# Patient Record
Sex: Male | Born: 1985 | Race: White | Hispanic: No | Marital: Married | Smoking: Never smoker
Health system: Southern US, Community
[De-identification: ages and names within clinical notes are randomized; demographics above are authoritative.]

---
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
## 2009-12-13 ENCOUNTER — Encounter: Admission: RE | Admit: 2009-12-13 | Discharge: 2009-12-13 | Payer: Self-pay | Admitting: Occupational Medicine

## 2010-03-01 ENCOUNTER — Encounter: Payer: Self-pay | Admitting: Family Medicine

## 2010-03-15 ENCOUNTER — Encounter: Payer: Self-pay | Admitting: Family Medicine

## 2010-03-15 ENCOUNTER — Ambulatory Visit: Payer: Self-pay | Admitting: Sports Medicine

## 2010-03-15 DIAGNOSIS — M7520 Bicipital tendinitis, unspecified shoulder: Secondary | ICD-10-CM | POA: Insufficient documentation

## 2010-07-05 NOTE — Consult Note (Signed)
Summary: [facility] [place]   Imported By: Marily Memos 03/03/2010 11:57:02  _____________________________________________________________________  External Attachment:    Type:   Image     Comment:   External Document

## 2010-07-05 NOTE — Letter (Signed)
Summary: *Consult Note  [facility]  [address]   [place], [place] [address]   Phone: [phone]  Fax: [phone]    Re:    Jeremy Bautista DOB:    [birth year]   Dear Dr. Kathrine Haddock, MD:    Thank you for requesting that we see the above patient for consultation.  A copy of the detailed office note will be sent under separate cover, for your review.  Evaluation today is consistent with:  1) BICEPS TENDINITIS, RIGHT (ICD-726.12)     - MSK ultrasound did not reveal any signs of tendon tear, but only fluid surrounding the tendon consistent with tendinitis.   Our recommendation is for:  - Continue with current restrictions, may increase amount of push-ups as pain allows, but should increase gradually.  Patient is currently able to do 12 push-ups. - Should ice shoulder/upper arm after work-outs/drills - Continue ibuprofen as needed   New Orders include:  [place] LIMITED ([id number])   New Medications started today include: None   After today's visit, the patients current medications include:   Thank you for this consultation.  If you have any further questions regarding the care of this patient, please do not hesitate to contact me @ [phone].   Thank you for this opportunity to look after your patient.  Sincerely,   Darene Lamer DO [facility]

## 2010-07-05 NOTE — Assessment & Plan Note (Signed)
Summary: W/C,U/S RT BICEP,MC   Vital Signs:  Patient profile:   25 year old male Height:      72 inches Weight:      235 pounds BMI:     31.99 Pulse rate:   76 / minute BP sitting:   143 / 80  (right arm)  Vitals Entered By: Rochele Pages RN (March 15, 2010 2:22 PM) CC: rt shoulder injury WC 02/11/10   CC:  rt shoulder injury WC 02/11/10.  History of Present Illness: 24yo R-hand dominant male to office with c/o R shoulder pain.  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  During exercise had discomfort in upper arm/shoulder & noticed this to be much worse with push-ups.  Pain increased in intensity & eventually evaluated by [facility].  Dx'd with bicep tendonitis & placed on light duty with no push-ups.  Pain persisted & evaluated by [facility], again dx'd with bicep tendonitis.  Referred to [place] for MSK U/S.  Pt cont to have pain in anterior shoulder.  States pain approx. 80% improved compared to 4-weeks ago. Able to do 12 push-ups without difficulty, was previously able to do over 40 push-ups prior to injury. Taking ibuprofen as needed which seems to help.  Is also icing & using heating pad as needed. Denies any popping or tearing sensation.  Denies any deformity of his bicep.  Denies prior shoulder or bicep injury.  Denies numbness/tingling.  Otherwise healthy without any other medical problems.  Allergies (verified): No Known Drug Allergies  Past History:  Past Medical History: Unremarkable  Past Surgical History: Denies surgical history  Review of Systems      See HPI  Physical Exam  General:  Well-developed,well-nourished,in no acute distress; alert,appropriate and cooperative throughout examination Msk:  NECK: normal ROM without pain or tenderness.  SHOULDER: Rt Shoulder: Inspection reveals no abnormalities, atrophy or asymmetry.  No  deformity of bicep noted. Mildly TTP over bicipital groove, no TTP over [facility] Joint. ROM is full in all planes without pain. Rotator cuff strength normal throughout.  Bicep strength +5/5 with mild pain, tricep strength +5/5 without pain. No signs of impingement with negative Neer and Hawkin's tests, empty can. Speeds & Yergason's mildly positive. No labral pathology noted with negative Obrien's and good stability. Normal scapular function observed. No painful arc and no drop arm sign. No apprehension sign - Lt shoulder with full ROM without pain, tenderness, weakness, instability.    Pulses:  +2/4 radial b/l Neurologic:  sensation intact to light touch.   Additional Exam:  MSK U/S: Rt shoulder - biceps tendon with surrounding fluid & (+)halo sign, but no signs of tendon or muscle tear.  Normal appearing supraspinatus, subscap, infraspinatus.  Normal appearing AC joint.  No dynamic impingement noted.  No increased doppler flow.  Comparison images of Lt bicep showed no surrounding fluid & no halo sign.  Images saved.   Impression & Recommendations:  Problem # 1:  BICEPS TENDINITIS, RIGHT (ICD-726.12) - Exam & MSK U/S finding consistent with biceps tendonitis on the right, no signs of tendon tear. - Pt has been improving & is approx. 80% improved from initial injury 02/11/10 - Should continue with current restrictions, but may increase number of push-ups gradually.  Should progress as pain allows, should decrease amount of push-ups if having increased pain. - Cont. ibuprofen as needed - Should ice following work-outs/drills. -  Follow-up with [place] on an as needed basis - Follow-up with Dr. Alto Denver at [facility] as directed  Orders: [place] LIMITED [phone])  Patient Instructions: 1)  You have biceps tendonitis of your right shoulder.  There were no signs of a tear on ultrasound today. 2)  You may continue with your current restrictions & slowly increase your intensity over the  next several weeks. 3)  You are currently able to do 12 push-ups, work to 15, then 20, then gradually increase as able.  You should decrease intensity if having increased pain. 4)  Cont. to use ibuprofen/advil as needed. 5)  Cont. to ice after workouts. 6)  Follow-up with [place] as needed or if not improving.

## 2013-01-13 ENCOUNTER — Encounter: Payer: Self-pay | Admitting: Emergency Medicine

## 2013-01-13 ENCOUNTER — Emergency Department
Admission: EM | Admit: 2013-01-13 | Discharge: 2013-01-13 | Disposition: A | Discharge status: Home / Self Care | Payer: 59 | Source: Home / Self Care | Attending: Family Medicine | Admitting: Family Medicine

## 2013-01-13 DIAGNOSIS — L255 Unspecified contact dermatitis due to plants, except food: Secondary | ICD-10-CM

## 2013-01-13 MED ORDER — TRIAMCINOLONE ACETONIDE 40 MG/ML IJ SUSP: 40.0000 mg | Freq: Once | INTRAMUSCULAR | Status: DC

## 2013-01-13 MED ORDER — DOMEBORO 25 % EX PACK: PACK | CUTANEOUS | Status: DC

## 2013-01-13 NOTE — ED Provider Notes (Signed)
  CSN: [id number]     Arrival date & time 01/13/13  6295 History     First MD Initiated Contact with Patient 01/13/13 1014     Chief Complaint  Patient presents with  . Rash      HPI Comments: Patient was trimming a tree 3 days ago, and subsequently developed a poison ivy rash on his left arm and left chest.  Patient is a 27 y.o. male presenting with poison ivy. The history is provided by the patient.  Poison [place] This is a new problem. Episode onset: 3 days ago. The problem occurs constantly. The problem has not changed since onset.Associated symptoms comments: none. Nothing aggravates the symptoms. Nothing relieves the symptoms. Treatments tried: Calamine lotion. The treatment provided mild relief.    History reviewed. No pertinent past medical history. Past Surgical History  Procedure Laterality Date  . Hernial cyst     Family History  Problem Relation Age of Onset  . Cancer Father   . Diabetes Father   . Hypertension Father    History  Substance Use Topics  . Smoking status: Never Smoker   . Smokeless tobacco: Not on file  . Alcohol Use: Yes    Review of Systems  All other systems reviewed and are negative.    Allergies  Review of patient's allergies indicates no known allergies.  Home Medications   Current Outpatient Rx  Name  Route  Sig  Dispense  Refill  . Alum Sulfate-Ca Acetate (DOMEBORO) 25 % PACK      Dissolve in water and apply as a compress TID   12 each   1    BP 129/80  Pulse 57  Temp(Src) 97.9 F (36.6 C) (Oral)  Ht 6\' 1"  (1.854 m)  Wt 257 lb (116.574 kg)  BMI 33.91 kg/m2  SpO2 99% Physical Exam  Nursing note and vitals reviewed. Constitutional: He is oriented to person, place, and time. He appears well-developed and well-nourished. No distress.  HENT:  Head: Atraumatic.  Eyes: Conjunctivae are normal. Pupils are equal, round, and reactive to light.  Neurological: He is alert and oriented to person, place, and time.  Skin: Skin is warm  and dry. Rash noted. Rash is vesicular.     On the left arm biceps area is a vesicular eruption on erythematous base about 5cm by 10cm.  Vesicles contain clear fluid.  No tenderness or swelling.  A similar lesion is present on the left lateral chest.  No evidence cellulitis.    ED Course   Procedures  none    1. Rhus dermatitis     MDM  Kenalog 40mg  IM Begin DomeBoro compresses. May take Benadryl 25mg , two caps at bedtime. Return if develops increased pain, redness, drainage, etc.  Call if not improving 3 to 4 days (could add prednisone burst)  Lattie Haw, MD 01/13/13 1036

## 2013-01-13 NOTE — ED Notes (Signed)
Poison Ivy on left arm, trunk x 3 days

## 2013-01-15 ENCOUNTER — Telehealth: Payer: Self-pay | Admitting: *Deleted

## 2013-01-23 ENCOUNTER — Encounter: Payer: Self-pay | Admitting: Emergency Medicine

## 2013-01-23 ENCOUNTER — Emergency Department
Admission: EM | Admit: 2013-01-23 | Discharge: 2013-01-23 | Disposition: A | Discharge status: Home / Self Care | Payer: 59 | Source: Home / Self Care | Attending: Emergency Medicine | Admitting: Emergency Medicine

## 2013-01-23 DIAGNOSIS — L255 Unspecified contact dermatitis due to plants, except food: Secondary | ICD-10-CM

## 2013-01-23 DIAGNOSIS — L237 Allergic contact dermatitis due to plants, except food: Secondary | ICD-10-CM

## 2013-01-23 MED ORDER — PREDNISONE 10 MG PO TABS: ORAL_TABLET | ORAL | Status: DC

## 2013-01-23 MED ORDER — TRIAMCINOLONE ACETONIDE 0.1 % EX CREA: TOPICAL_CREAM | Freq: Two times a day (BID) | CUTANEOUS | Status: DC

## 2013-01-23 NOTE — ED Provider Notes (Signed)
CSN: [id number]     Arrival date & time 01/23/13  4098 History     First MD Initiated Contact with Patient 01/23/13 0845     Chief Complaint  Patient presents with  . Poison Ivy   (Consider location/radiation/quality/duration/timing/severity/associated sxs/prior Treatment) HPI This patient's followup from his visit last week in which he was diagnosed with poison ivy.  He he states that the shot and the cream did not help but then started taking prednisone and it seemed to be drying up a lot.  However while he was tapered off at the end the poison ivy came back more then it began.  He does have some itching but there is a lot of swelling redness and irritation of his abdomen and left arm.  He also feels that is a firm swelling that seems to be getting worse.  History reviewed. No pertinent past medical history. Past Surgical History  Procedure Laterality Date  . Hernial cyst     Family History  Problem Relation Age of Onset  . Cancer Father   . Diabetes Father   . Hypertension Father    History  Substance Use Topics  . Smoking status: Never Smoker   . Smokeless tobacco: Not on file  . Alcohol Use: Yes    Review of Systems  All other systems reviewed and are negative.    Allergies  Review of patient's allergies indicates no known allergies.  Home Medications   Current Outpatient Rx  Name  Route  Sig  Dispense  Refill  . Alum Sulfate-Ca Acetate (DOMEBORO) 25 % PACK      Dissolve in water and apply as a compress TID   12 each   1   . predniSONE (DELTASONE) 10 MG tablet      20mg  TID for 3 days, then 20mg  BID for 3 days, then 10mg  BID for 3 days, then 10mg  QD for 3 days.  Disp QS   42 tablet   0   . triamcinolone cream (KENALOG) 0.1 %   Topical   Apply topically 2 (two) times daily. Apply to affected area BID (torso and L arm)   454 g   0    BP 142/90  Pulse 78  Temp(Src) 98 F (36.7 C) (Oral)  Ht 6\' 1"  (1.854 m)  Wt 250 lb (113.399 kg)  BMI 32.99 kg/m2   SpO2 99% Physical Exam  Nursing note and vitals reviewed. Constitutional: He is oriented to person, place, and time. He appears well-developed and well-nourished. No distress.  HENT:  Head: Normocephalic and atraumatic.  Eyes: No scleral icterus.  Neck: Neck supple.  Cardiovascular: Regular rhythm and normal heart sounds.   Pulmonary/Chest: Effort normal and breath sounds normal. No respiratory distress.  Neurological: He is alert and oriented to person, place, and time.  Skin: Skin is warm and dry.     Patient with a poison ivy dermatitis on the areas marked.  It is swollen and indurated with no fluctuance.  Careful inspection shows no definite cellulitis or areas of infection.  No tenderness to palpation.   Psychiatric: He has a normal mood and affect. His speech is normal.    ED Course   Procedures (including critical care time)  Labs Reviewed - No data to display No results found. 1. Poison ivy     MDM    patient has severe case of poison ivy dermatitis.  I discussed with him that the first prednisone dose is probably too short for his case.  I extended it an additional 12 days as well as giving him triamcinolone cream to use symptomatically.  I suspect that over the course of the next one to 2 weeks and should improve.  If it's not and he wanted to follow up with dermatologist.  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  Also should take cool showers and use cool compresses.  Can use over-the-counter antihistamines if necessary.  Patient understands and agrees to this plan.  In addition I do not see any signs of cellulitis however feet develops worsening symptoms it may be reasonable to put him on a short course of Keflex.    Marlaine Hind, MD 01/23/13 1004

## 2013-01-23 NOTE — ED Notes (Signed)
Poison Ivy x 2 weeks, trunk, left arm, saw him last week treated with kenalog, called in prednisone, it got better, but now it's a lot worse.

## 2013-01-30 ENCOUNTER — Telehealth: Payer: Self-pay | Admitting: *Deleted

## 2013-10-02 ENCOUNTER — Emergency Department
Admission: EM | Admit: 2013-10-02 | Discharge: 2013-10-02 | Disposition: A | Discharge status: Home / Self Care | Payer: 59 | Source: Home / Self Care | Attending: Emergency Medicine | Admitting: Emergency Medicine

## 2013-10-02 ENCOUNTER — Encounter: Payer: Self-pay | Admitting: Emergency Medicine

## 2013-10-02 DIAGNOSIS — L255 Unspecified contact dermatitis due to plants, except food: Secondary | ICD-10-CM

## 2013-10-02 DIAGNOSIS — L237 Allergic contact dermatitis due to plants, except food: Secondary | ICD-10-CM

## 2013-10-02 MED ORDER — PREDNISONE 10 MG PO TABS: ORAL_TABLET | ORAL | Status: DC

## 2013-10-02 NOTE — ED Notes (Signed)
Jeremy Bautista c/o poison ivy rash to both arms and left ear x 3-4 days.

## 2013-10-02 NOTE — ED Provider Notes (Signed)
CSN: [id number]     Arrival date & time 10/02/13  1430 History   First MD Initiated Contact with Patient 10/02/13 1432     Chief Complaint  Patient presents with  . Poison Ivy   (Consider location/radiation/quality/duration/timing/severity/associated sxs/prior Treatment) HPI Severe, worsening, pruritic poison ivy rash for 4 days. Both arms and left external ear, now starting on left trunk. History reviewed. No pertinent past medical history. Past Surgical History  Procedure Laterality Date  . Hernial cyst     Family History  Problem Relation Age of Onset  . Cancer Father   . Diabetes Father   . Hypertension Father    History  Substance Use Topics  . Smoking status: Never Smoker   . Smokeless tobacco: Never Used  . Alcohol Use: Yes    Review of Systems  All other systems reviewed and are negative.   Allergies  Review of patient's allergies indicates no known allergies.  Home Medications   Prior to Admission medications   Medication Sig Start Date End Date Taking? Authorizing Provider  Alum Sulfate-Ca Acetate (DOMEBORO) 25 % PACK Dissolve in water and apply as a compress TID 01/13/13   Lattie HawStephen A Beese, MD  predniSONE (DELTASONE) 10 MG tablet Days 1 & 2, take 6 daily. Days 3 & 4, take 5 daily. Days 5 & 6: 4 daily. Days 7 &8: 3 daily. Days 9 &10: 2 daily. Days 11 &12: 1 daily. 10/02/13   Lajean Manesavid Massey, MD   BP 133/83  Pulse 73  Temp(Src) 98.5 F (36.9 C) (Oral)  Resp 14  Wt 267 lb (121.11 kg)  SpO2 98% Physical Exam  Nursing note and vitals reviewed. Constitutional: He is oriented to person, place, and time. He appears well-developed and well-nourished. No distress.  HENT:  Head: Normocephalic and atraumatic.  Eyes: Conjunctivae and EOM are normal. Pupils are equal, round, and reactive to light. No scleral icterus.  Neck: Normal range of motion.  Cardiovascular: Normal rate.   Pulmonary/Chest: Effort normal.  Abdominal: He exhibits no distension.  Musculoskeletal:  Normal range of motion.  Neurological: He is alert and oriented to person, place, and time.  Skin: Skin is warm.  Poison ivy type rash. Erythematous papules in clusters and streaks, some vesicles. No pustules or drainage. Located both arms, left external ear, and a few on trunk  Psychiatric: He has a normal mood and affect.    ED Course  Procedures (including critical care time) Labs Review Labs Reviewed - No data to display  Imaging Review No results found.   MDM   1. Poison ivy dermatitis    Treatment options discussed, as well as risks, benefits, alternatives. Patient voiced understanding and agreement with the following plans: Prednisone 10 mg-12 day Dosepak (he states that in the past, 6 day Dosepak has not been long enough treatment to resolve poison ivy) Other symptomatic care discussed Domeboro's topically which he has at home Follow-up with your primary care doctor in 5-7 days if not improving, or sooner if symptoms become worse. Precautions discussed. Red flags discussed. Questions invited and answered. Patient voiced understanding and agreement.     Lajean Manesavid Massey, MD 10/02/13 2116

## 2014-01-15 ENCOUNTER — Emergency Department
Admission: EM | Admit: 2014-01-15 | Discharge: 2014-01-15 | Disposition: A | Discharge status: Home / Self Care | Payer: 59 | Source: Home / Self Care | Attending: Emergency Medicine | Admitting: Emergency Medicine

## 2014-01-15 ENCOUNTER — Encounter: Payer: Self-pay | Admitting: Emergency Medicine

## 2014-01-15 DIAGNOSIS — L237 Allergic contact dermatitis due to plants, except food: Secondary | ICD-10-CM

## 2014-01-15 DIAGNOSIS — L255 Unspecified contact dermatitis due to plants, except food: Secondary | ICD-10-CM

## 2014-01-15 MED ORDER — PREDNISONE 10 MG PO TABS: ORAL_TABLET | ORAL | Status: DC

## 2014-01-15 NOTE — ED Notes (Signed)
8 days ago noticed rash on left ankle, now left upper arm and a few spots on his trunk. He has been treated for this here before and had a severe case last summer. 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

## 2014-01-15 NOTE — ED Provider Notes (Signed)
CSN: [id number]     Arrival date & time 01/15/14  1246 History   First MD Initiated Contact with Patient 01/15/14 1250     Chief Complaint  Patient presents with  . Poison Ivy   (Consider location/radiation/quality/duration/timing/severity/associated sxs/prior Treatment) HPI Exposed to poison ivy in his yard Has severe pruritic poison ivy rash. 8 days ago noticed rash on left ankle, now left upper arm and a few spots on his trunk. He has been treated for this here before and had a severe case last summer. 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 Topical steroids only helping minimally History reviewed. No pertinent past medical history. Past Surgical History  Procedure Laterality Date  . Hernial cyst     Family History  Problem Relation Age of Onset  . Cancer Father   . Diabetes Father   . Hypertension Father    History  Substance Use Topics  . Smoking status: Never Smoker   . Smokeless tobacco: Never Used  . Alcohol Use: Yes    Review of Systems  All other systems reviewed and are negative.   Allergies  Review of patient's allergies indicates not on file.  Home Medications   Prior to Admission medications   Medication Sig Start Date End Date Taking? Authorizing Provider  Alum Sulfate-Ca Acetate (DOMEBORO) 25 % PACK Dissolve in water and apply as a compress TID 01/13/13   Lattie HawStephen A Beese, MD  predniSONE (DELTASONE) 10 MG tablet Days 1 & 2, take 6 daily. Days 3 & 4, take 5 daily. Days 5 & 6: 4 daily. Days 7 &8: 3 daily. Days 9 &10: 2 daily. Days 11 &12: 1 daily. 01/15/14   Lajean Manesavid Massey, MD   BP 130/80  Pulse 76  Temp(Src) 98.6 F (37 C) (Oral)  Ht 6\' 1"  (1.854 m)  Wt 280 lb (127.007 kg)  BMI 36.95 kg/m2  SpO2 99% Physical Exam  Nursing note and vitals reviewed. Constitutional: He is oriented to person, place, and time. He appears well-developed and well-nourished. No distress.  HENT:  Head: Normocephalic and atraumatic.  Eyes:  Conjunctivae and EOM are normal. Pupils are equal, round, and reactive to light. No scleral icterus.  Neck: Normal range of motion.  Cardiovascular: Normal rate.   Pulmonary/Chest: Effort normal.  Abdominal: He exhibits no distension.  Musculoskeletal: Normal range of motion.  Neurological: He is alert and oriented to person, place, and time.  Skin: Skin is warm.  Severe poison ivy rash in patches on trunk and extremities few on neck. Erythematous, vesicles.  Psychiatric: He has a normal mood and affect.    ED Course  Procedures (including critical care time) Labs Review Labs Reviewed - No data to display  Imaging Review No results found.   MDM   1. Poison ivy dermatitis    Treatment options discussed, as well as risks, benefits, alternatives. Patient voiced understanding and agreement with the following plans: He declined a shot of steroids such as Depo-Medrol. Prednisone 10 mg-12 day Dosepak Other symptomatic care  Followup with PCP or dermatologist prn Precautions discussed. Red flags discussed. Questions invited and answered. Patient voiced understanding and agreement.     Lajean Manesavid Massey, MD 01/15/14 [phone]

## 2014-04-21 ENCOUNTER — Encounter: Payer: Self-pay | Admitting: Family Medicine

## 2014-04-21 ENCOUNTER — Ambulatory Visit (INDEPENDENT_AMBULATORY_CARE_PROVIDER_SITE_OTHER): Payer: 59 | Admitting: Family Medicine

## 2014-04-21 VITALS — BP 120/78 | Ht 72.0 in | Wt 288.0 lb

## 2014-04-21 DIAGNOSIS — R5383 Other fatigue: Secondary | ICD-10-CM

## 2014-04-21 DIAGNOSIS — G5710 Meralgia paresthetica, unspecified lower limb: Secondary | ICD-10-CM | POA: Insufficient documentation

## 2014-04-21 DIAGNOSIS — N529 Male erectile dysfunction, unspecified: Secondary | ICD-10-CM

## 2014-04-21 DIAGNOSIS — G5712 Meralgia paresthetica, left lower limb: Secondary | ICD-10-CM

## 2014-04-21 DIAGNOSIS — R6882 Decreased libido: Secondary | ICD-10-CM

## 2014-04-21 NOTE — Progress Notes (Signed)
CC: Jeremy Bautista is a 28 y.o. 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 He denies any other genitourinary complaints. Symptoms have been worsening over the past 6 months and can occur any day. It has been accompanied by significant decline in his libido despite still being physically and emotionally attracted to his wife of one year. Nothing particularly makes symptoms better or worse. Overall symptoms are described as moderate to severe in severity. Denies testicular pain, swelling, penile discharge, dysuria, nor mental disturbance.  Complains of fatigue described further as lethargy that occurs on a daily basis. He says it's also accompanied by nonrestorative sleep and often the feeling that he could doze off at work. He works on the night shift and never on the day shift. Symptoms for the described as low energy and have been present for greater than 3 months on a daily basis now. Nothing particularly makes better or worse. He is uncertain about snoring and knows of no witnessed apneic episodes  Complains of a numbness in his left lateral thigh that has been present on a nightly basis for a matter of months on a daily basis. Has not been getting better or worse. It is painless and does not necessarily bother his quality of life. He wears a equipment belt daily at work and has a Armed forces technical officerTaser that sticks into his left lateral groin while wearing the spell. He denies any other motor or sensory disturbances  Review of Systems - General ROS: negative for - chills, fever, night sweats, weight gain or weight loss Ophthalmic ROS: negative for - decreased vision Psychological ROS: negative for - anxiety or depression ENT ROS: negative for - hearing change, nasal congestion, tinnitus or allergies Hematological and  Lymphatic ROS: negative for - bleeding problems, bruising or swollen lymph nodes Breast ROS: negative Respiratory ROS: no cough, shortness of breath, or wheezing Cardiovascular ROS: no chest pain or dyspnea on exertion Gastrointestinal ROS: no abdominal pain, change in bowel habits, or black or bloody stools Genito-Urinary ROS: negative for - genital discharge, genital ulcers, incontinence or abnormal bleeding from genitals Musculoskeletal ROS: negative for - joint pain or muscle pain Neurological ROS: negative for - headaches or memory loss Dermatological ROS: negative for lumps, mole changes, rash and skin lesion changes  No past medical history on file.  Past Surgical History  Procedure Laterality Date  . Hernial cyst  1990   Family History  Problem Relation Age of Onset  . Cancer Father   . Diabetes Father   . Hypertension Father     History   Social History  . Marital Status: Single    Spouse Name: N/A    Number of Children: N/A  . Years of Education: N/A   Occupational History  . Not on file.   Social History Main Topics  . Smoking status: Never Smoker   . Smokeless tobacco: Never Used  . Alcohol Use: 0.0 oz/week    0 Not specified per week  . Drug Use: No  . Sexual Activity:    Partners: Female   Other Topics Concern  . Not on file   Social History Narrative     Objective: BP 120/78 mmHg  Ht 6' (1.829 m)  Wt 288 lb (130.636 kg)  BMI 39.05 kg/m2  General: Alert and Oriented, No Acute Distress HEENT: Pupils equal, round,  reactive to light. Conjunctivae clear.  Moist mucous membranes pharynx unremarkable Lungs: Clear to auscultation bilaterally, no wheezing/ronchi/rales.  Comfortable work of breathing. Good air movement. Cardiac: Regular rate and rhythm. Normal S1/S2.  No murmurs, rubs, nor gallops.   Abdomen: obese Extremities: No peripheral edema.  Strong peripheral pulses.  Mental Status: No depression, anxiety, nor agitation. Skin: Warm and  dry.  Assessment & Plan: Jeremy Bautista was seen today for establish care.  Diagnoses and associated orders for this visit:  Low libido - Testosterone - TSH - COMPLETE METABOLIC PANEL WITH GFR  Erectile dysfunction, unspecified erectile dysfunction type - Testosterone - TSH - COMPLETE METABOLIC PANEL WITH GFR  Other fatigue - Testosterone - TSH - COMPLETE METABOLIC PANEL WITH GFR - CBC  Meralgia paraesthetica, left    Low libido with erectile dysfunction: Checking testosterone level and ruling out metabolic abnormality, rule out hypothyroidism Fatigue: Labs above, rule out anemia. If no source of his fatigue is found with the above labs next step will be a sleep study Meralgia paresthetica: discussed the benign nature of this condition and that it should improve if he loosens his belt or avoids compression in his left groin or lateral hip   Return if symptoms worsen or fail to improve.

## 2014-04-22 ENCOUNTER — Telehealth: Payer: Self-pay | Admitting: Family Medicine

## 2014-04-22 DIAGNOSIS — E291 Testicular hypofunction: Secondary | ICD-10-CM | POA: Insufficient documentation

## 2014-04-22 DIAGNOSIS — Z79899 Other long term (current) drug therapy: Secondary | ICD-10-CM

## 2014-04-22 LAB — TESTOSTERONE: TESTOSTERONE: 183 ng/dL — AB (ref 300–890)

## 2014-04-22 LAB — CBC
HEMATOCRIT: 45.7 % (ref 39.0–52.0)
Hemoglobin: 15.6 g/dL (ref 13.0–17.0)
MCH: 28.2 pg (ref 26.0–34.0)
MCHC: 34.1 g/dL (ref 30.0–36.0)
MCV: 82.6 fL (ref 78.0–100.0)
MPV: 9.8 fL (ref 9.4–12.4)
PLATELETS: 386 10*3/uL (ref 150–400)
RBC: 5.53 MIL/uL (ref 4.22–5.81)
RDW: 14.1 % (ref 11.5–15.5)
WBC: 8.5 10*3/uL (ref 4.0–10.5)

## 2014-04-22 LAB — TSH: TSH: 1.156 u[IU]/mL (ref 0.350–4.500)

## 2014-04-22 LAB — COMPLETE METABOLIC PANEL WITH GFR
ALK PHOS: 65 U/L (ref 39–117)
ALT: 36 U/L (ref 0–53)
AST: 27 U/L (ref 0–37)
Albumin: 4.5 g/dL (ref 3.5–5.2)
BUN: 10 mg/dL (ref 6–23)
CO2: 28 mEq/L (ref 19–32)
CREATININE: 1.1 mg/dL (ref 0.50–1.35)
Calcium: 9.7 mg/dL (ref 8.4–10.5)
Chloride: 102 mEq/L (ref 96–112)
GFR, Est Non African American: >89 mL/min
GLUCOSE: 81 mg/dL (ref 70–99)
Potassium: 4.8 mEq/L (ref 3.5–5.3)
Sodium: 141 mEq/L (ref 135–145)
TOTAL PROTEIN: 7.8 g/dL (ref 6.0–8.3)
Total Bilirubin: 0.8 mg/dL (ref 0.2–1.2)

## 2014-04-22 NOTE — Telephone Encounter (Signed)
Sue Lushndrea, Will you please let patient know that his testosterone is significantly below the lower limit of normal.  If he's still interested in testosterone supplementation then he'll need to have a repeat testosterone along with a PSA prostate test obtained through bloodwork.  (printed in your inbox).  I'd recommend he wait until Tuesday of next week at the earliest to have this done in order for it to be most accurate.

## 2014-04-22 NOTE — Telephone Encounter (Signed)
Pt.notified

## 2014-04-30 LAB — PSA: PSA: 0.53 ng/mL (ref <=4.00)

## 2014-04-30 LAB — TESTOSTERONE: TESTOSTERONE: 175 ng/dL — AB (ref 300–890)

## 2014-05-06 ENCOUNTER — Ambulatory Visit (INDEPENDENT_AMBULATORY_CARE_PROVIDER_SITE_OTHER): Payer: 59 | Admitting: Family Medicine

## 2014-05-06 ENCOUNTER — Encounter: Payer: Self-pay | Admitting: Family Medicine

## 2014-05-06 VITALS — BP 127/76 | HR 56 | Wt 287.0 lb

## 2014-05-06 DIAGNOSIS — E291 Testicular hypofunction: Secondary | ICD-10-CM

## 2014-05-06 MED ORDER — TESTOSTERONE 50 MG/5GM (1%) TD GEL: 5.0000 g | Freq: Every day | TRANSDERMAL | Status: DC

## 2014-05-06 NOTE — Progress Notes (Signed)
CC: Jeremy Bautista is a 28 y.o. male is here for discuss testosterone supplementation   Subjective: HPI:  Follow-up hypogonadism: He continues to express frustration with low libido and difficulty initiating and maintaining an erection all of which is described as severe in severity. It's been present for at least 6 months on a daily basis. Nothing particularly makes it better or worse. He's had 2 testosterone levels below 200 since I saw him last a normal PSA and normal hemoglobin. He's done some research and wants to know if he can start testosterone supplementation. He has many well but I'll questions regarding risks and benefits of both topical and injectable therapy.   Review Of Systems Outlined In HPI  No past medical history on file.  Past Surgical History  Procedure Laterality Date  . Hernial cyst  1990   Family History  Problem Relation Age of Onset  . Cancer Father   . Diabetes Father   . Hypertension Father     History   Social History  . Marital Status: Single    Spouse Name: N/A    Number of Children: N/A  . Years of Education: N/A   Occupational History  . Not on file.   Social History Main Topics  . Smoking status: Never Smoker   . Smokeless tobacco: Never Used  . Alcohol Use: 0.0 oz/week    0 Not specified per week  . Drug Use: No  . Sexual Activity:    Partners: Female   Other Topics Concern  . Not on file   Social History Narrative     Objective: BP 127/76 mmHg  Pulse 56  Wt 287 lb (130.182 kg)  Vital signs reviewed. General: Alert and Oriented, No Acute Distress HEENT: Pupils equal, round, reactive to light. Conjunctivae clear.  External ears unremarkable.  Moist mucous membranes. Lungs: Clear and comfortable work of breathing, speaking in full sentences without accessory muscle use. Cardiac: Regular rate and rhythm.  Neuro: CN II-XII grossly intact, gait normal. Extremities: No peripheral edema.  Strong peripheral pulses.  Mental Status:  No depression, anxiety, nor agitation. Logical though process. Skin: Warm and dry. Assessment & Plan: Jeremy Bautista was seen today for discuss testosterone supplementation.  Diagnoses and associated orders for this visit:  Hypogonadism in male  Other Orders - testosterone (TESTIM) 50 MG/5GM (1%) GEL; Place 5 g onto the skin daily.    Hypogonadism: uncontrolled,Time was taken to answer all questions and provide counseling regarding the risks and benefits of testosterone supplementation. Discussed that regardless of his regimen will check testosterone and hemoglobin and PSA in about 3-6 months. He would prefer to take the topical approach however if financially unreasonable is open to the idea of doing injectables, he is going to his pharmacy today with the above prescription to see if it's affordable.  25 minutes spent face-to-face during visit today of which at least 50% was counseling or coordinating care regarding: 1. Hypogonadism in male       Return in about 3 months (around 08/05/2014).

## 2014-06-17 ENCOUNTER — Ambulatory Visit (INDEPENDENT_AMBULATORY_CARE_PROVIDER_SITE_OTHER): Payer: 59 | Admitting: Family Medicine

## 2014-06-17 ENCOUNTER — Telehealth: Payer: Self-pay | Admitting: Family Medicine

## 2014-06-17 VITALS — BP 131/74 | HR 79 | Ht 73.0 in | Wt 297.0 lb

## 2014-06-17 DIAGNOSIS — E291 Testicular hypofunction: Secondary | ICD-10-CM

## 2014-06-17 MED ORDER — TESTOSTERONE CYPIONATE 200 MG/ML IM SOLN
300.0000 mg | Freq: Once | INTRAMUSCULAR | Status: AC
  Administered 2014-06-17: 300 mg via INTRAMUSCULAR

## 2014-06-17 NOTE — Telephone Encounter (Signed)
May begin IM testosterone if topical is unaffordable.

## 2014-06-17 NOTE — Progress Notes (Signed)
   Subjective:    Patient ID: Jeremy Bautista, male    DOB: 07/17/1985, 29 y.o.   MRN: [id number]  HPI  Jeremy Bautista came to office today for testosterone injection which he received without complication. Today was his first injection and he was counseled on injection, dosage and possible side effects. He verbalized understanding. We also went over his med list and made sure that he understood that he was not to use the topical testosterone gel anymore while he was receiving the injection and he also verbalized understanding of this change and was in agreeance.    Review of Systems     Objective:   Physical Exam        Assessment & Plan:  He was instructed to schedule follow up injection for 3 weeks.

## 2014-07-08 ENCOUNTER — Ambulatory Visit (INDEPENDENT_AMBULATORY_CARE_PROVIDER_SITE_OTHER): Payer: 59 | Admitting: Family Medicine

## 2014-07-08 VITALS — BP 128/79 | HR 70 | Resp 16 | Wt 300.0 lb

## 2014-07-08 DIAGNOSIS — E291 Testicular hypofunction: Secondary | ICD-10-CM

## 2014-07-08 MED ORDER — TESTOSTERONE CYPIONATE 200 MG/ML IM SOLN
300.0000 mg | Freq: Once | INTRAMUSCULAR | Status: AC
  Administered 2014-07-08: 300 mg via INTRAMUSCULAR

## 2014-07-08 NOTE — Progress Notes (Signed)
   Subjective:    Patient ID: Jeremy Bautista, male    DOB: 02/02/1986, 29 y.o.   MRN: [id number]  HPIPatient here for a testosterone injection. Denies chest pain, shortness of breath, headaches or mood changes.      Review of Systems     Objective:   Physical Exam        Assessment & Plan:  Patient tolerated injection well without complications. Patient advised to schedule his next injection for 3 weeks from today.

## 2014-07-30 ENCOUNTER — Ambulatory Visit (INDEPENDENT_AMBULATORY_CARE_PROVIDER_SITE_OTHER): Payer: 59 | Admitting: Family Medicine

## 2014-07-30 VITALS — BP 141/65 | HR 85 | Ht 73.0 in | Wt 304.0 lb

## 2014-07-30 DIAGNOSIS — E291 Testicular hypofunction: Secondary | ICD-10-CM

## 2014-07-30 MED ORDER — TESTOSTERONE CYPIONATE 200 MG/ML IM SOLN
300.0000 mg | Freq: Once | INTRAMUSCULAR | Status: AC
  Administered 2014-07-30: 300 mg via INTRAMUSCULAR

## 2014-07-30 NOTE — Progress Notes (Signed)
   Subjective:    Patient ID: Jeremy Bautista, male    DOB: 08/07/1985, 29 y.o.   MRN: [id number]   Pt was in for a nurse visit for testosterone injection. Given RUOQ with no complications. [address]bony Brigham, SCMA Donne AnonAmber Jyra Bautista, [place]  HPI    Review of Systems     Objective:   Physical Exam        Assessment & Plan:

## 2014-08-14 ENCOUNTER — Emergency Department
Admission: EM | Admit: 2014-08-14 | Discharge: 2014-08-14 | Disposition: A | Discharge status: Home / Self Care | Payer: 59 | Source: Home / Self Care | Attending: Emergency Medicine | Admitting: Emergency Medicine

## 2014-08-14 ENCOUNTER — Encounter: Payer: Self-pay | Admitting: *Deleted

## 2014-08-14 DIAGNOSIS — J029 Acute pharyngitis, unspecified: Secondary | ICD-10-CM

## 2014-08-14 LAB — POCT RAPID STREP A (OFFICE): Rapid Strep A Screen: NEGATIVE

## 2014-08-14 NOTE — ED Notes (Signed)
Pt c/o sore throat, HA, hoarseness and white patches on his throat x today.

## 2014-08-14 NOTE — ED Provider Notes (Signed)
CSN: [id number]     Arrival date & time 08/14/14  1622 History   First MD Initiated Contact with Patient 08/14/14 1655     Chief Complaint  Patient presents with  . Sore Throat   (Consider location/radiation/quality/duration/timing/severity/associated sxs/prior Treatment) Patient is a 29 y.o. male presenting with pharyngitis. The history is provided by the patient. No language interpreter was used.  Sore Throat This is a new problem. The current episode started 12 to 24 hours ago. The problem occurs constantly. The problem has not changed since onset.Pertinent negatives include no headaches. Nothing aggravates the symptoms. Nothing relieves the symptoms. The treatment provided no relief.    History reviewed. No pertinent past medical history. Past Surgical History  Procedure Laterality Date  . Hernial cyst  1990   Family History  Problem Relation Age of Onset  . Cancer Father   . Diabetes Father   . Hypertension Father    History  Substance Use Topics  . Smoking status: Never Smoker   . Smokeless tobacco: Never Used  . Alcohol Use: 0.0 oz/week    0 Standard drinks or equivalent per week    Review of Systems  Neurological: Negative for headaches.  All other systems reviewed and are negative.   Allergies  Review of patient's allergies indicates no known allergies.  Home Medications   Prior to Admission medications   Medication Sig Start Date End Date Taking? Authorizing Provider  IBUPROFEN IB PO Take by mouth.    Historical Provider, MD  Multiple Vitamin (MULTIVITAMIN) capsule Take 1 capsule by mouth daily.    Historical Provider, MD  testosterone cypionate (DEPO-TESTOSTERONE) 200 MG/ML injection 300mg  IM every 21 days in clinic 06/17/14   Sean Hommel, DO   BP 131/81 mmHg  Pulse 86  Temp(Src) 98.4 F (36.9 C) (Oral)  Resp 18  Ht 6\' 1"  (1.854 m)  Wt 304 lb (137.893 kg)  BMI 40.12 kg/m2  SpO2 99% Physical Exam  Constitutional: He is oriented to person, place, and  time. He appears well-developed and well-nourished.  HENT:  Head: Normocephalic and atraumatic.  Right Ear: External ear normal.  Left Ear: External ear normal.  Erythema throat,  Small amount of exudate,   Eyes: Conjunctivae and EOM are normal. Pupils are equal, round, and reactive to light.  Neck: Normal range of motion.  Cardiovascular: Normal rate.   Pulmonary/Chest: Effort normal.  Abdominal: He exhibits no distension.  Musculoskeletal: Normal range of motion.  Neurological: He is alert and oriented to person, place, and time.  Psychiatric: He has a normal mood and affect.  Nursing note and vitals reviewed.   ED Course  Procedures (including critical care time) Labs Review Labs Reviewed  STREP A DNA PROBE   Strep negative Strep culture pending  Imaging Review No results found.   MDM   1. Acute pharyngitis, unspecified pharyngitis type    Pt advised ibuprofen for soreness Symptomatic care AVS    Elson AreasLeslie K Sofia, PA-C 08/14/14 1900

## 2014-08-14 NOTE — Discharge Instructions (Signed)

## 2014-08-15 LAB — STREP A DNA PROBE: GASP: NEGATIVE

## 2014-08-16 ENCOUNTER — Telehealth: Payer: Self-pay | Admitting: Emergency Medicine

## 2014-08-20 ENCOUNTER — Ambulatory Visit (INDEPENDENT_AMBULATORY_CARE_PROVIDER_SITE_OTHER): Payer: 59 | Admitting: Family Medicine

## 2014-08-20 ENCOUNTER — Encounter: Payer: Self-pay | Admitting: Family Medicine

## 2014-08-20 VITALS — BP 129/81 | HR 76 | Ht 73.0 in | Wt 305.0 lb

## 2014-08-20 DIAGNOSIS — E291 Testicular hypofunction: Secondary | ICD-10-CM

## 2014-08-20 MED ORDER — TESTOSTERONE CYPIONATE 200 MG/ML IM SOLN
300.0000 mg | Freq: Once | INTRAMUSCULAR | Status: AC
  Administered 2014-08-20: 300 mg via INTRAMUSCULAR

## 2014-08-20 NOTE — Progress Notes (Signed)
   Subjective:    Patient ID: Jeremy Bautista, male    DOB: 10/25/1985, 29 y.o.   MRN: [id number]  HPI Patient came into office today for testosterone injection. Denies chest pain, shortness of breath, headaches and problems associated with taking this medication. Patient states he has had no abnornal mood swings but has experienced some weight gain since beginning Testosterone injections.   Review of Systems     Objective:   Physical Exam        Assessment & Plan:  Patient tolerated injection in LUOQ well without complications. Patient advised to schedule his next injection for 3 weeks from today. No further questions.

## 2014-09-10 ENCOUNTER — Ambulatory Visit (INDEPENDENT_AMBULATORY_CARE_PROVIDER_SITE_OTHER): Payer: 59 | Admitting: Family Medicine

## 2014-09-10 ENCOUNTER — Telehealth: Payer: Self-pay | Admitting: *Deleted

## 2014-09-10 VITALS — BP 151/84 | HR 83 | Ht 73.0 in | Wt 308.0 lb

## 2014-09-10 DIAGNOSIS — E291 Testicular hypofunction: Secondary | ICD-10-CM | POA: Diagnosis not present

## 2014-09-10 MED ORDER — TESTOSTERONE CYPIONATE 200 MG/ML IM SOLN
300.0000 mg | Freq: Once | INTRAMUSCULAR | Status: AC
  Administered 2014-09-10: 300 mg via INTRAMUSCULAR

## 2014-09-10 MED ORDER — TESTOSTERONE CYPIONATE 200 MG/ML IM SOLN: INTRAMUSCULAR | Status: DC

## 2014-09-10 NOTE — Progress Notes (Signed)
   Subjective:    Patient ID: Jeremy Bautista, male    DOB: 11/13/1985, 29 y.o.   MRN: [id number]  HPI Patient came into office today for testosterone injection. Denies chest pain, shortness of breath, headaches and problems associated with taking this medication. Patient states he has had no abnornal mood swings.    Review of Systems     Objective:   Physical Exam        Assessment & Plan:  Patient tolerated injection in ROUQ well without complications. Patient advised to schedule his next injection for 3 weeks from today.

## 2014-09-10 NOTE — Telephone Encounter (Signed)
Pt.notified

## 2014-09-10 NOTE — Progress Notes (Signed)
Jeremy Bautista, Will you please let patient know that I've advised he have his testosterone level rechecked sometime between April14th-April20th to make sure his dosage is appropriate.  Labs in your inbox.

## 2014-09-10 NOTE — Telephone Encounter (Signed)
-----   Message from Laren BoomSean Hommel, DO sent at 09/10/2014  9:12 AM EDT ----- Sue LushAndrea, Will you please let patient know that I've advised he have his testosterone level rechecked sometime between April14th-April20th to make sure his dosage is appropriate.  Labs in your inbox.

## 2014-09-22 LAB — TESTOSTERONE: TESTOSTERONE: 573 ng/dL (ref 300–890)

## 2014-09-22 LAB — HEMOGLOBIN: HEMOGLOBIN: 15.5 g/dL (ref 13.0–17.0)

## 2014-10-01 ENCOUNTER — Encounter: Payer: Self-pay | Admitting: Family Medicine

## 2014-10-01 ENCOUNTER — Ambulatory Visit (INDEPENDENT_AMBULATORY_CARE_PROVIDER_SITE_OTHER): Payer: 59 | Admitting: Family Medicine

## 2014-10-01 VITALS — BP 131/72 | HR 82 | Ht 73.0 in | Wt 303.0 lb

## 2014-10-01 DIAGNOSIS — E291 Testicular hypofunction: Secondary | ICD-10-CM

## 2014-10-01 MED ORDER — TESTOSTERONE CYPIONATE 200 MG/ML IM SOLN
350.0000 mg | Freq: Once | INTRAMUSCULAR | Status: AC
  Administered 2014-10-01: 350 mg via INTRAMUSCULAR

## 2014-10-01 NOTE — Addendum Note (Signed)
Addended by: Wyline BeadyMCCRIMMON, ANDREA C on: 10/01/2014 08:51 AM   Modules accepted: Orders

## 2014-10-01 NOTE — Progress Notes (Signed)
CC: Jeremy Bautista is a 29 y.o. male is here for Follow-up   Subjective: HPI:  Follow-up hypogonadism: Has been receiving testosterone 300 mg every 3 weeks. He denies any known side effects. His wife has noticed that his libido is a little bit improved however he tells me he still has difficulty with initiating and maintaining an erection. This symptom has persisted to remain in a moderate degree and has not gotten any better or worse and started on testosterone. He tells me it mildly to moderately interferes with his quality of life. He is going to go see a urologist early in May for further workup of this. He denies any mood swings, motor or sensory disturbances, chest pain.   Review Of Systems Outlined In HPI  No past medical history on file.  Past Surgical History  Procedure Laterality Date  . Hernial cyst  1990   Family History  Problem Relation Age of Onset  . Cancer Father   . Diabetes Father   . Hypertension Father     History   Social History  . Marital Status: Married    Spouse Name: N/A  . Number of Children: N/A  . Years of Education: N/A   Occupational History  . Not on file.   Social History Main Topics  . Smoking status: Never Smoker   . Smokeless tobacco: Never Used  . Alcohol Use: 0.0 oz/week    0 Standard drinks or equivalent per week  . Drug Use: No  . Sexual Activity:    Partners: Female   Other Topics Concern  . Not on file   Social History Narrative     Objective: BP 131/72 mmHg  Pulse 82  Ht 6\' 1"  (1.854 m)  Wt 303 lb (137.44 kg)  BMI 39.98 kg/m2  Vital signs reviewed. General: Alert and Oriented, No Acute Distress HEENT: Pupils equal, round, reactive to light. Conjunctivae clear.  External ears unremarkable.  Moist mucous membranes. Lungs: Clear and comfortable work of breathing, speaking in full sentences without accessory muscle use. Cardiac: Regular rate and rhythm.  Neuro: CN II-XII grossly intact, gait normal. Extremities: No  peripheral edema.  Strong peripheral pulses.  Mental Status: No depression, anxiety, nor agitation. Logical though process. Skin: Warm and dry.  Assessment & Plan: Jeremy Bautista was seen today for follow-up.  Diagnoses and all orders for this visit:  Hypogonadism in male   Hypogonadism: Testosterone level checked last week was in the therapeutic range but in the middle of the normal range. Discussed risks and benefits of being a little bit more aggressive to try to get a higher level of the therapeutic range without going over a value of 890. Joint decision to increase testosterone to 350 mg IM every 3 weeks which she will start today. Follow-up 3 months.  Return in about 3 months (around 12/31/2014).

## 2014-10-22 ENCOUNTER — Ambulatory Visit: Payer: 59

## 2014-12-31 ENCOUNTER — Ambulatory Visit: Payer: 59 | Admitting: Family Medicine

## 2015-02-18 ENCOUNTER — Telehealth: Payer: Self-pay

## 2015-02-18 NOTE — Telephone Encounter (Signed)
Patient reports poison ivy on chest, left arm, waist and legs. He states he was advised to call in if he needs a prescription.

## 2015-02-19 ENCOUNTER — Encounter: Payer: Self-pay | Admitting: Osteopathic Medicine

## 2015-02-19 ENCOUNTER — Ambulatory Visit (INDEPENDENT_AMBULATORY_CARE_PROVIDER_SITE_OTHER): Payer: 59 | Admitting: Osteopathic Medicine

## 2015-02-19 VITALS — BP 144/94 | HR 90 | Temp 98.9°F | Ht 73.0 in | Wt 300.0 lb

## 2015-02-19 DIAGNOSIS — L237 Allergic contact dermatitis due to plants, except food: Secondary | ICD-10-CM

## 2015-02-19 DIAGNOSIS — L740 Miliaria rubra: Secondary | ICD-10-CM

## 2015-02-19 MED ORDER — PREDNISONE 10 MG (21) PO TBPK: ORAL_TABLET | ORAL | Status: DC

## 2015-02-19 MED ORDER — HYDROXYZINE HCL 25 MG PO TABS: 25.0000 mg | ORAL_TABLET | Freq: Three times a day (TID) | ORAL | Status: DC | PRN

## 2015-02-19 NOTE — Telephone Encounter (Signed)
This would require an office visit 

## 2015-02-19 NOTE — Telephone Encounter (Signed)
Pt.notified

## 2015-02-19 NOTE — Patient Instructions (Signed)
Keep skin clean and dry as discussed. Can continue to use calamine lotion. Keep an eye on this rash, if it does not improve in the next week or so or if it gets worse or any other concerns please come back to the office for further evaluation

## 2015-02-19 NOTE — Progress Notes (Signed)
HPI: Jeremy Bautista is a 29 y.o. male who presents to [facility]  today for chief complaint of:  Chief Complaint  Patient presents with  . Rash   . Location: chest, left arm, waist, legs . Quality: itching . Severity: moderate . Duration: 1 week   Past medical, social and family history reviewed:  Patient Active Problem List   Diagnosis Date Noted  . Hypogonadism in male 04/22/2014  . Meralgia paraesthetica 04/21/2014  . Low libido 04/21/2014  . Erectile dysfunction 04/21/2014  . BICEPS TENDINITIS, RIGHT 03/15/2010   Past Surgical History  Procedure Laterality Date  . Hernial cyst  1990   Social History  Substance Use Topics  . Smoking status: Never Smoker   . Smokeless tobacco: Never Used  . Alcohol Use: 0.0 oz/week    0 Standard drinks or equivalent per week   Current Outpatient Prescriptions  Medication Sig Dispense Refill  . IBUPROFEN IB PO Take by mouth.    . Multiple Vitamin (MULTIVITAMIN) capsule Take 1 capsule by mouth daily.    Marland Kitchen testosterone cypionate (DEPO-TESTOSTERONE) 200 MG/ML injection  IM every 21 days in clinic 10 mL 0   No current facility-administered medications for this visit.   No Known Allergies    Review of Systems: CONSTITUTIONAL: Neg fever/chills, no unintentional weight changes CARDIAC: No chest pain/pressure/palpitations RESPIRATORY: No cough/shortness of breath GASTROINTESTINAL: No nausea/vomiting/abdominal pain/blood in stool/diarrhea/constipation MUSCULOSKELETAL: No myalgia/arthralgia SKIN: rash concerning for poison ivy as per HPI HEM/ONC: No easy bruising/bleeding, no abnormal lymph node  Exam:  BP 144/94 mmHg  Pulse 90  Temp(Src) 98.9 F (37.2 C) (Oral)  Ht  (1.854 m)  Wt 300 lb (136.079 kg)  BMI 39.59 kg/m2 Constitutional: VSS, see above. General Appearance: alert, well-developed, well-nourished, NAD Respiratory: Normal respiratory effort. no  wheeze/rhonchi/rales Cardiovascular: S1/S2 normal, no murmur/rub/gallop auscultated. RRR.  Integumentary: Scattered urticarial rash, worse on left leg, abdomen, right and left arm, consistent with contact dermatitis patient also has erythematous papular rash on left groin area consistent with heat rash  No results found for this or any previous visit (from the past 72 hour(s)).    ASSESSMENT/PLAN:  Poison ivy dermatitis - Plan: hydrOXYzine (ATARAX/VISTARIL) 25 MG tablet, predniSONE (STERAPRED UNI-PAK 21 TAB) 10 MG (21) TBPK tablet  Heat rash  Keep skin dry, can use calamine lotion as needed for itching as well as above medications. Return to clinic if rash has not improved. Patient reports stressed-out, blood pressure mildly elevated, encouraged to come back for blood pressure check and routine follow-up with PCP

## 2015-02-24 ENCOUNTER — Telehealth: Payer: Self-pay

## 2015-02-24 MED ORDER — PREDNISONE 10 MG (21) PO TBPK: ORAL_TABLET | ORAL | Status: DC

## 2015-02-24 NOTE — Telephone Encounter (Signed)
Patient advised.

## 2015-02-24 NOTE — Telephone Encounter (Signed)
Jeremy Bautista called and asked for a longer prednisone taper. The areas were resolving but since he has decreased the prednisone the rash is getting worse. Please advise.

## 2016-10-09 ENCOUNTER — Ambulatory Visit (INDEPENDENT_AMBULATORY_CARE_PROVIDER_SITE_OTHER): Payer: 59 | Admitting: Osteopathic Medicine

## 2016-10-09 ENCOUNTER — Encounter: Payer: Self-pay | Admitting: Osteopathic Medicine

## 2016-10-09 VITALS — BP 129/78 | HR 81 | Ht 73.0 in | Wt 309.0 lb

## 2016-10-09 DIAGNOSIS — L237 Allergic contact dermatitis due to plants, except food: Secondary | ICD-10-CM | POA: Diagnosis not present

## 2016-10-09 MED ORDER — PREDNISONE 10 MG (21) PO TBPK: ORAL_TABLET | ORAL | 0 refills | Status: DC

## 2016-10-09 NOTE — Patient Instructions (Addendum)
Please call if the steroids are not helping in the next few days - we can try topical steroid cream for itching, we can sen din a strong antihistamine to help with sleeping.

## 2016-10-09 NOTE — Progress Notes (Signed)
HPI: Jeremy Bautista is a 31 y.o. male who [facility]  [place] for chief complaint of:  Chief Complaint  Patient presents with  . Other    switch from hommel/ poison ivy   . Location: upper back is worst, also on buttocks . Quality: itching . Severity: moderate    Past medical, social and family history reviewed:  Patient Active Problem List   Diagnosis Date Noted  . Hypogonadism in male 04/22/2014  . Meralgia paraesthetica 04/21/2014  . Low libido 04/21/2014  . Erectile dysfunction 04/21/2014  . BICEPS TENDINITIS, RIGHT 03/15/2010   Past Surgical History:  Procedure Laterality Date  . Hernial cyst  1990   Social History  Substance Use Topics  . Smoking status: Never Smoker  . Smokeless tobacco: Never Used  . Alcohol use 0.0 oz/week   Current Outpatient Prescriptions  Medication Sig Dispense Refill  . IBUPROFEN IB PO Take by mouth.    . Multiple Vitamin (MULTIVITAMIN) capsule Take 1 capsule by mouth daily.    Marland Kitchen. testosterone cypionate (DEPO-TESTOSTERONE) 200 MG/ML injection 350mg  IM every 21 days in clinic 10 mL 0   No current facility-administered medications for this visit.   No Known Allergies    Review of Systems: CONSTITUTIONAL: Neg fever/chills CARDIAC: No chest pain GASTROINTESTINAL: No nausea/vomiting/abdominal pain MUSCULOSKELETAL: No myalgia/arthralgia SKIN: rash concerning for poison ivy as per HPI   Exam:  BP 129/78   Pulse 81   Ht 6\' 1"  (1.854 m)   Wt (!) 309 lb (140.2 kg)   BMI 40.77 kg/m  Constitutional: VSS, see above. General Appearance: alert, well-developed, well-nourished, NAD Respiratory: Normal respiratory effort. Integumentary: Scattered urticarial blistering rash, worse on upper mid back leg, consistent with contact dermatitis     ASSESSMENT/PLAN: The encounter diagnosis was Poison ivy dermatitis. Decline steroid shot or po antihistamines. Printed info given on prevent/treat poison  ivy, thi shappens to heim every spring   Patient Instructions  Please call if the steroids are not helping in the next few days - we can try topical steroid cream for itching, we can sen din a strong antihistamine to help with sleeping.

## 2016-10-23 ENCOUNTER — Other Ambulatory Visit: Payer: Self-pay | Admitting: Osteopathic Medicine

## 2016-10-24 ENCOUNTER — Telehealth: Payer: Self-pay

## 2016-10-24 ENCOUNTER — Other Ambulatory Visit: Payer: Self-pay | Admitting: Osteopathic Medicine

## 2016-10-24 MED ORDER — PREDNISONE 10 MG (21) PO TBPK: ORAL_TABLET | ORAL | 0 refills | Status: DC

## 2016-10-24 NOTE — Telephone Encounter (Signed)
Spoke to patient advised him that rx was sent to pharmacy and he verbally understood that appointment is needed if it comes back. Rhonda Cunningham,CMA

## 2016-10-24 NOTE — Telephone Encounter (Signed)
Okay to refill but if comes back after this he should return to the office for further evaluation

## 2016-10-24 NOTE — Telephone Encounter (Signed)
Patient called stated that he has completed his prednisone but the poison ivy has come back, so he is requesting a refill on Prednisone. Please advise. Jazalynn Mireles,CMA

## 2017-05-11 ENCOUNTER — Ambulatory Visit: Payer: 59 | Admitting: Osteopathic Medicine

## 2017-05-11 ENCOUNTER — Encounter: Payer: Self-pay | Admitting: Osteopathic Medicine

## 2017-05-11 VITALS — BP 122/65 | HR 97 | Temp 98.9°F

## 2017-05-11 DIAGNOSIS — J101 Influenza due to other identified influenza virus with other respiratory manifestations: Secondary | ICD-10-CM | POA: Diagnosis not present

## 2017-05-11 LAB — POCT INFLUENZA A/B
INFLUENZA A, POC: POSITIVE — AB
INFLUENZA B, POC: NEGATIVE

## 2017-05-11 LAB — POCT RAPID STREP A (OFFICE): Rapid Strep A Screen: NEGATIVE

## 2017-05-11 MED ORDER — OSELTAMIVIR PHOSPHATE 75 MG PO CAPS: 75.0000 mg | ORAL_CAPSULE | Freq: Two times a day (BID) | ORAL | 0 refills | Status: DC

## 2017-05-11 MED ORDER — BENZONATATE 200 MG PO CAPS: 200.0000 mg | ORAL_CAPSULE | Freq: Three times a day (TID) | ORAL | 0 refills | Status: DC | PRN

## 2017-05-11 NOTE — Progress Notes (Signed)
HPI: Jeremy Bautista is a 31 y.o. male who  has no past medical history on file.  he presents to [facility] today, 05/11/17,  for chief complaint of:  Chief Complaint  Patient presents with  . Fever    . Temp 103 yesterday, 106 this AM on forehead monitor but 102 on oral monitor not long after that, 1000 mg Tylenol given 08:00. 98.86F now   Most bothersome symptom is sore throat and cough. No GI symptoms such as nausea, vomiting, diarrhea  Patient has not had a flu shot . Nosebleed this morning as well on awakening,   Patient is accompanied by wife who assists with history-taking.   Past medical, surgical, social and family history reviewed:  Patient Active Problem List   Diagnosis Date Noted  . Hypogonadism in male 04/22/2014  . Meralgia paraesthetica 04/21/2014  . Low libido 04/21/2014  . Erectile dysfunction 04/21/2014  . BICEPS TENDINITIS, RIGHT 03/15/2010    Past Surgical History:  Procedure Laterality Date  . Hernial cyst  1990    Social History   Tobacco Use  . Smoking status: Never Smoker  . Smokeless tobacco: Never Used  Substance Use Topics  . Alcohol use: Yes    Alcohol/week: 0.0 oz    Family History  Problem Relation Age of Onset  . Cancer Father   . Diabetes Father   . Hypertension Father      Current medication list and allergy/intolerance information reviewed:    Current Outpatient Medications  Medication Sig Dispense Refill  . anastrozole (ARIMIDEX) 1 MG tablet Take 1 tablet (1 mg total) by mouth 3 (three) times a week. 1 tablet 0  . clomiPHENE (CLOMID) 50 MG tablet Take 1 tablet (50 mg total) by mouth 2 (two) times a week. 1 tablet 0  . Multiple Vitamin (MULTIVITAMIN) capsule Take 1 capsule by mouth daily.    . predniSONE (STERAPRED UNI-PAK 21 TAB) 10 MG (21) TBPK tablet 12 day pack 48 tablet 0   No current facility-administered medications for this visit.     No Known Allergies    Review of  Systems:  Constitutional:  +fever, +chills, +recent illness, No unintentional weight changes. +significant fatigue.   HEENT: No  headache, no vision change, no hearing change, +sore throat, +sinus pressure  Cardiac: No  chest pain, No  pressure  Respiratory:  No  shortness of breath. +Cough  Gastrointestinal: No  abdominal pain, No  nausea, No  vomiting,  No  blood in stool, No  diarrhea, No  constipation   Musculoskeletal: No new myalgia/arthralgia  Skin: No  Rash   Exam:  BP 122/65   Pulse 97   Temp 98.9 F (37.2 C) (Oral)   Constitutional: VS see above. General Appearance: alert, well-developed, well-nourished, NAD  Eyes: Normal lids and conjunctive, non-icteric sclera  Ears, Nose, Mouth, Throat: MMM, Normal external inspection ears/nares/mouth/lips/gums. TM normal bilaterally. Pharynx/tonsils +erythema, no exudate. Nasal mucosa normal with some irritation to R septum anteriorly c/w recent bleed   Neck: No masses, trachea midline. No thyroid enlargement. No tenderness/mass appreciated. No lymphadenopathy  Respiratory: Normal respiratory effort. no wheeze, no rhonchi, no rales  Cardiovascular: S1/S2 normal, no murmur, no rub/gallop auscultated. RRR. No lower extremity edema..  Musculoskeletal: Gait normal.  Neurological: Normal balance/coordination. No tremor  Skin: warm, dry, intact. No rash/ulcer.     Psychiatric: Normal judgment/insight. Normal mood and affect. Oriented x3.     ASSESSMENT/PLAN:   Influenza A - Plan: POCT Influenza A/B,  POCT rapid strep A   Meds ordered this encounter  Medications  . oseltamivir (TAMIFLU) 75 MG capsule    Sig: Take 1 capsule (75 mg total) by mouth 2 (two) times daily. For 5 days    Dispense:  10 capsule    Refill:  0  . benzonatate (TESSALON) 200 MG capsule    Sig: Take 1 capsule (200 mg total) by mouth 3 (three) times daily as needed for cough.    Dispense:  30 capsule    Refill:  0        Patient Instructions   Influenza, Adult Influenza, more commonly known as "the flu," is a viral infection that primarily affects the respiratory tract. The respiratory tract includes organs that help you breathe, such as the lungs, nose, and throat. The flu causes many common cold symptoms, as well as a high fever and body aches. The flu spreads easily from person to person (is contagious). Getting a flu shot (influenza vaccination) every year is the best way to prevent influenza. What are the causes? Influenza is caused by a virus. You can catch the virus by:  Breathing in droplets from an infected person's cough or sneeze.  Touching something that was recently contaminated with the virus and then touching your mouth, nose, or eyes.  What increases the risk? The following factors may make you more likely to get the flu:  Not cleaning your hands frequently with soap and water or alcohol-based hand sanitizer.  Having close contact with many people during cold and flu season.  Touching your mouth, eyes, or nose without washing or sanitizing your hands first.  Not drinking enough fluids or not eating a healthy diet.  Not getting enough sleep or exercise.  Being under a high amount of stress.  Not getting a yearly (annual) flu shot.  You may be at a higher risk of complications from the flu, such as a severe lung infection (pneumonia), if you:  Are over the age of 29.  Are pregnant.  Have a weakened disease-fighting system (immune system). You may have a weakened immune system if you: ? Have HIV or AIDS. ? Are undergoing chemotherapy. ? Aretaking medicines that reduce the activity of (suppress) the immune system.  Have a long-term (chronic) illness, such as heart disease, kidney disease, diabetes, or lung disease.  Have a liver disorder.  Are obese.  Have anemia.  What are the signs or symptoms? Symptoms of this condition typically last 4-10 days and may  include:  Fever.  Chills.  Headache, body aches, or muscle aches.  Sore throat.  Cough.  Runny or congested nose.  Chest discomfort and cough.  Poor appetite.  Weakness or tiredness (fatigue).  Dizziness.  Nausea or vomiting.  How is this diagnosed? This condition may be diagnosed based on your medical history and a physical exam. Your health care provider may do a nose or throat swab test to confirm the diagnosis. How is this treated? If influenza is detected early, you can be treated with antiviral medicine that can reduce the length of your illness and the severity of your symptoms. This medicine may be given by mouth (orally) or through an IV tube that is inserted in one of your veins. The goal of treatment is to relieve symptoms by taking care of yourself at home. This may include taking over-the-counter medicines, drinking plenty of fluids, and adding humidity to the air in your home. In some cases, influenza goes away on its own. Severe  influenza or complications from influenza may be treated in a hospital. Follow these instructions at home:  Take over-the-counter and prescription medicines only as told by your health care provider.  Use a cool mist humidifier to add humidity to the air in your home. This can make breathing easier.  Rest as needed.  Drink enough fluid to keep your urine clear or pale yellow.  Cover your mouth and nose when you cough or sneeze.  Wash your hands with soap and water often, especially after you cough or sneeze. If soap and water are not available, use hand sanitizer.  Stay home from work or school as told by your health care provider. Unless you are visiting your health care provider, try to avoid leaving home until your fever has been gone for 24 hours without the use of medicine.  Keep all follow-up visits as told by your health care provider. This is important. How is this prevented?  Getting an annual flu shot is the best way  to avoid getting the flu. You may get the flu shot in late summer, fall, or winter. Ask your health care provider when you should get your flu shot.  Wash your hands often or use hand sanitizer often.  Avoid contact with people who are sick during cold and flu season.  Eat a healthy diet, drink plenty of fluids, get enough sleep, and exercise regularly. Contact a health care provider if:  You develop new symptoms.  You have: ? Chest pain. ? Diarrhea. ? A fever.  Your cough gets worse.  You produce more mucus.  You feel nauseous or you vomit. Get help right away if:  You develop shortness of breath or difficulty breathing.  Your skin or nails turn a bluish color.  You have severe pain or stiffness in your neck.  You develop a sudden headache or sudden pain in your face or ear.  You cannot stop vomiting. This information is not intended to replace advice given to you by your health care provider. Make sure you discuss any questions you have with your health care provider. Document Released: 05/19/2000 Document Revised: 10/28/2015 Document Reviewed: 03/16/2015 Elsevier Interactive Patient Education  2017 Elsevier Inc.   Ppx dose Rx written for 2 weeks for unvaccinated spouse who doesn't have PCP. They were advised to contact their pediatrician for child's ppx dosing Wife: Orlean BradfordHannah Minniefield - DOB 05/19/87 No PMH other than IBS Allergies: amoxicillin caused hives, as an infant; Norflex caused migraine     Visit summary with medication list and pertinent instructions was printed for patient to review. All questions at time of visit were answered - patient instructed to contact office with any additional concerns. ER/RTC precautions were reviewed with the patient. Follow-up plan: Return if symptoms worsen or fail to improve.  Note: Total time spent 25 minutes, greater than 50% of the visit was spent face-to-face counseling and coordinating care for the following: The encounter  diagnosis was Influenza A..  Please note: voice recognition software was used to produce this document, and typos may escape review. Please contact Dr. Lyn HollingsheadAlexander for any needed clarifications.

## 2017-05-11 NOTE — Patient Instructions (Addendum)

## 2017-05-13 ENCOUNTER — Encounter: Payer: Self-pay | Admitting: Osteopathic Medicine

## 2017-05-30 ENCOUNTER — Encounter: Payer: Self-pay | Admitting: Physician Assistant

## 2017-05-30 ENCOUNTER — Ambulatory Visit (INDEPENDENT_AMBULATORY_CARE_PROVIDER_SITE_OTHER): Payer: 59

## 2017-05-30 ENCOUNTER — Ambulatory Visit: Payer: 59 | Admitting: Physician Assistant

## 2017-05-30 VITALS — BP 136/84 | HR 104 | Temp 98.4°F | Resp 16 | Ht 73.0 in | Wt 307.5 lb

## 2017-05-30 DIAGNOSIS — J029 Acute pharyngitis, unspecified: Secondary | ICD-10-CM | POA: Diagnosis not present

## 2017-05-30 DIAGNOSIS — R058 Other specified cough: Secondary | ICD-10-CM

## 2017-05-30 DIAGNOSIS — R05 Cough: Secondary | ICD-10-CM

## 2017-05-30 LAB — POCT RAPID STREP A (OFFICE): RAPID STREP A SCREEN: NEGATIVE

## 2017-05-30 MED ORDER — AMOXICILLIN-POT CLAVULANATE 875-125 MG PO TABS: 1.0000 | ORAL_TABLET | Freq: Two times a day (BID) | ORAL | 0 refills | Status: AC

## 2017-05-30 NOTE — Progress Notes (Signed)
HPI:                                                                Jeremy Bautista is a 31 y.o. male who [facility] today for sore throat and cough  Sore Throat   This is a recurrent problem. The current episode started in the past 7 days. The problem has been gradually worsening. Neither side of throat is experiencing more pain than the other. The maximum temperature recorded prior to his arrival was 102 - 102.9 F. Associated symptoms include congestion, coughing, headaches and neck pain. Pertinent negatives include no diarrhea, drooling, ear pain, stridor, trouble swallowing or vomiting. He has had no exposure to strep or mono. He has tried NSAIDs and acetaminophen for the symptoms.  Patient was diagnosed with influenza A 3 weeksago. Completed Tamiflu. Reports he felt improved except for a lingering cough. Reports second sickening 5 days ago with fever, myalgias, sore throat.    History reviewed. No pertinent past medical history. Past Surgical History:  Procedure Laterality Date  . Hernial cyst  1990   Social History   Tobacco Use  . Smoking status: Never Smoker  . Smokeless tobacco: Never Used  Substance Use Topics  . Alcohol use: Yes    Alcohol/week: 0.0 oz   family history includes Cancer in his father; Diabetes in his father; Hypertension in his father.  ROS: negative except as noted in the HPI  Medications: Current Outpatient Medications  Medication Sig Dispense Refill  . acetaminophen (TYLENOL) 500 MG tablet Take 500 mg by mouth every 6 (six) hours as needed.    . benzonatate (TESSALON) 200 MG capsule Take 1 capsule (200 mg total) by mouth 3 (three) times daily as needed for cough. 30 capsule 0  . Multiple Vitamin (MULTIVITAMIN) capsule Take 1 capsule by mouth daily.     No current facility-administered medications for this visit.    No Known Allergies     Objective:  BP 136/84   Pulse (!) 104   Temp  98.4 F (36.9 C) (Oral)   Resp 16   Ht 6\' 1"  (1.854 m)   Wt (!) 307 lb 8 oz (139.5 kg)   SpO2 96%   BMI 40.57 kg/m  Gen:  alert, not ill-appearing, no distress, appropriate for age, morbidly obese male HEENT: head normocephalic without obvious abnormality, conjunctiva and cornea clear, oropharynx with erythema and tonsillar exudates, uvula midline, neck supple, there is posterior cervical adenopathy, trachea midline Pulm: Normal work of breathing, normal phonation, clear to auscultation bilaterally, no wheezes, rales or rhonchi CV: Tachycardic, regular rhythm, s1 and s2 distinct, no murmurs, clicks or rubs  Neuro: alert and oriented x 3, no tremor MSK: extremities atraumatic, normal gait and station Skin: intact, urticaria of the anterior neck in the beard distribution   Results for orders placed or performed in visit on 05/30/17 (from the past 72 hour(s))  POCT rapid strep A     Status: Normal   Collection Time: 05/30/17  1:58 PM  Result Value Ref Range   Rapid Strep A Screen Negative Negative   No results found.    Assessment and Plan: 31 y.o. male with   1. Acute pharyngitis, unspecified etiology - POCT rapid  strep A negative. Centor score 2. Given fever and second sickening, will cover for bacterial pharyngitis with Augmentin - CBC with Differential/Platelet - Comprehensive metabolic panel - Epstein-Barr virus VCA antibody panel - amoxicillin-clavulanate (AUGMENTIN) 875-125 MG tablet; Take 1 tablet by mouth 2 (two) times daily for 7 days.  Dispense: 14 tablet; Refill: 0  2. Cough productive of purulent sputum - DG Chest 2 View to assess for infiltrate - continue Mucinex and Tessalon    Patient education and anticipatory guidance given Patient agrees with treatment plan Follow-up as needed if symptoms worsen or fail to improve  Levonne Hubertharley E. Kieren Ricci PA-C

## 2017-05-30 NOTE — Patient Instructions (Addendum)
- Continue Tylenol 1000 mg every 8 hours and Ibuprofen 600 mg every 6 hours as needed for pain/fever - Augmentin twice a day for 1 week - Okay to continue Mucinex and Tessalon - Drink plenty of fluids - Chest x-ray and labs today  Pharyngitis Pharyngitis is redness, pain, and swelling (inflammation) of the throat (pharynx). It is a very common cause of sore throat. Pharyngitis can be caused by a bacteria, but it is usually caused by a virus. Most cases of pharyngitis get better on their own without treatment. What are the causes? This condition may be caused by:  Infection by viruses (viral). Viral pharyngitis spreads from person to person (is contagious) through coughing, sneezing, and sharing of personal items or utensils such as cups, forks, spoons, and toothbrushes.  Infection by bacteria (bacterial). Bacterial pharyngitis may be spread by touching the nose or face after coming in contact with the bacteria, or through more intimate contact, such as kissing.  Allergies. Allergies can cause buildup of mucus in the throat (post-nasal drip), leading to inflammation and irritation. Allergies can also cause blocked nasal passages, forcing breathing through the mouth, which dries and irritates the throat.  What increases the risk? You are more likely to develop this condition if:  You are 29-29 years old.  You are exposed to crowded environments such as daycare, school, or dormitory living.  You live in a cold climate.  You have a weakened disease-fighting (immune) system.  What are the signs or symptoms? Symptoms of this condition vary by the cause (viral, bacterial, or allergies) and can include:  Sore throat.  Fatigue.  Low-grade fever.  Headache.  Joint pain and muscle aches.  Skin rashes.  Swollen glands in the throat (lymph nodes).  Plaque-like film on the throat or tonsils. This is often a symptom of bacterial pharyngitis.  Vomiting.  Stuffy nose (nasal  congestion).  Cough.  Red, itchy eyes (conjunctivitis).  Loss of appetite.  How is this diagnosed? This condition is often diagnosed based on your medical history and a physical exam. Your health care provider will ask you questions about your illness and your symptoms. A swab of your throat may be done to check for bacteria (rapid strep test). Other lab tests may also be done, depending on the suspected cause, but these are rare. How is this treated? This condition usually gets better in 3-4 days without medicine. Bacterial pharyngitis may be treated with antibiotic medicines. Follow these instructions at home:  Take over-the-counter and prescription medicines only as told by your health care provider. ? If you were prescribed an antibiotic medicine, take it as told by your health care provider. Do not stop taking the antibiotic even if you start to feel better. ? Do not give children aspirin because of the association with Reye syndrome.  Drink enough water and fluids to keep your urine clear or pale yellow.  Get a lot of rest.  Gargle with a salt-water mixture 3-4 times a day or as needed. To make a salt-water mixture, completely dissolve -1 tsp of salt in 1 cup of warm water.  If your health care provider approves, you may use throat lozenges or sprays to soothe your throat. Contact a health care provider if:  You have large, tender lumps in your neck.  You have a rash.  You cough up green, yellow-brown, or bloody spit. Get help right away if:  Your neck becomes stiff.  You drool or are unable to swallow liquids.  You  cannot drink or take medicines without vomiting.  You have severe pain that does not go away, even after you take medicine.  You have trouble breathing, and it is not caused by a stuffy nose.  You have new pain and swelling in your joints such as the knees, ankles, wrists, or elbows. Summary  Pharyngitis is redness, pain, and swelling (inflammation)  of the throat (pharynx).  While pharyngitis can be caused by a bacteria, the most common causes are viral.  Most cases of pharyngitis get better on their own without treatment.  Bacterial pharyngitis is treated with antibiotic medicines. This information is not intended to replace advice given to you by your health care provider. Make sure you discuss any questions you have with your health care provider. Document Released: 05/22/2005 Document Revised: 06/27/2016 Document Reviewed: 06/27/2016 Elsevier Interactive Patient Education  [employer]2018 Elsevier Inc.

## 2017-05-31 LAB — CBC WITH DIFFERENTIAL/PLATELET
BASOS ABS: 40 {cells}/uL (ref 0–200)
Basophils Relative: 0.4 %
EOS ABS: 40 {cells}/uL (ref 15–500)
EOS PCT: 0.4 %
HCT: 44.5 % (ref 38.5–50.0)
HEMOGLOBIN: 15.3 g/dL (ref 13.2–17.1)
Lymphs Abs: 2247 cells/uL (ref 850–3900)
MCH: 27.5 pg (ref 27.0–33.0)
MCHC: 34.4 g/dL (ref 32.0–36.0)
MCV: 80 fL (ref 80.0–100.0)
MONOS PCT: 10.6 %
MPV: 9.6 fL (ref 7.5–12.5)
NEUTROS ABS: 6524 {cells}/uL (ref 1500–7800)
NEUTROS PCT: 65.9 %
PLATELETS: 320 10*3/uL (ref 140–400)
RBC: 5.56 10*6/uL (ref 4.20–5.80)
RDW: 13.2 % (ref 11.0–15.0)
TOTAL LYMPHOCYTE: 22.7 %
WBC mixed population: 1049 cells/uL — ABNORMAL HIGH (ref 200–950)
WBC: 9.9 10*3/uL (ref 3.8–10.8)

## 2017-05-31 LAB — COMPREHENSIVE METABOLIC PANEL
AG RATIO: 1.4 (calc) (ref 1.0–2.5)
ALT: 42 U/L (ref 9–46)
AST: 31 U/L (ref 10–40)
Albumin: 4.5 g/dL (ref 3.6–5.1)
Alkaline phosphatase (APISO): 72 U/L (ref 40–115)
BUN: 8 mg/dL (ref 7–25)
CO2: 26 mmol/L (ref 20–32)
CREATININE: 1.03 mg/dL (ref 0.60–1.35)
Calcium: 9.2 mg/dL (ref 8.6–10.3)
Chloride: 102 mmol/L (ref 98–110)
GLUCOSE: 91 mg/dL (ref 65–99)
Globulin: 3.2 g/dL (calc) (ref 1.9–3.7)
Potassium: 4.5 mmol/L (ref 3.5–5.3)
SODIUM: 140 mmol/L (ref 135–146)
TOTAL PROTEIN: 7.7 g/dL (ref 6.1–8.1)
Total Bilirubin: 0.5 mg/dL (ref 0.2–1.2)

## 2017-05-31 LAB — EPSTEIN-BARR VIRUS VCA ANTIBODY PANEL
EBV NA IGG: 30.3 U/mL — AB
EBV VCA IGG: 146 U/mL — AB
EBV VCA IgM: <36 U/mL

## 2017-05-31 NOTE — Progress Notes (Signed)
Good morning Jeremy Bautista,  Your labs look good. White blood cell count is normal. You show immunity to infectious mono, meaning you have had this virus in the past but there is no active infection. Your chest x-ray was also normal. Treatment plan does not change. Please follow-up with Dr. Lyn HollingsheadAlexander as discussed if you are not feeling better by Friday.   Best, Vinetta Bergamoharley

## 2017-06-09 ENCOUNTER — Encounter: Payer: Self-pay | Admitting: Physician Assistant

## 2018-07-01 ENCOUNTER — Other Ambulatory Visit: Payer: Self-pay | Admitting: Nurse Practitioner

## 2018-07-01 ENCOUNTER — Ambulatory Visit
Admission: RE | Admit: 2018-07-01 | Discharge: 2018-07-01 | Disposition: A | Discharge status: Home / Self Care | Payer: Worker's Compensation | Source: Ambulatory Visit | Attending: Nurse Practitioner | Admitting: Nurse Practitioner

## 2018-07-01 DIAGNOSIS — R52 Pain, unspecified: Secondary | ICD-10-CM

## 2018-07-05 ENCOUNTER — Telehealth: Payer: Self-pay

## 2018-07-05 NOTE — Telephone Encounter (Signed)
It looks pretty close to the skin.  I can try to get it.  Have him set a 30-minute visit with me, I will try to see it on ultrasound and then do an exploration to take it out.  Does he definitely hurt just lateral to the fifth metatarsal head?

## 2018-07-05 NOTE — Telephone Encounter (Signed)
Jeremy Bautista can you take a look at this Xray? It's a little sliver of something, seems like it might be tricky to find but is this something you might be able to localize on an ultrasound and remove pretty easily, or does he need podiatry/ortho? Thanks!

## 2018-07-05 NOTE — Telephone Encounter (Signed)
Vy had a foot x-ray at [facility]. Xray results are in the chart. He had the xray through his work thinking he had a fracture. He actually has a probable foreign body. His wife called to see what would be the best next step.

## 2018-07-05 NOTE — Telephone Encounter (Signed)
Pt's wife called stating that results for foot showed foreign metallic body. Wants to know if pt should have a referral for podiatrist or an appt w/provider. Pt continues to have pain in the foot. Pls advise, thanks.

## 2018-07-08 NOTE — Telephone Encounter (Signed)
Patient scheduled.

## 2018-07-08 NOTE — Telephone Encounter (Signed)
Thomas - No idea about his pain really, this was a phone call I got, haven't actually seen the patient.  Marylene Land - can we call and get the patient scheduled w/ Dr T for a 30 min slot? Thanks!

## 2018-07-16 ENCOUNTER — Ambulatory Visit: Payer: 59 | Admitting: Sports Medicine

## 2018-07-16 ENCOUNTER — Ambulatory Visit (INDEPENDENT_AMBULATORY_CARE_PROVIDER_SITE_OTHER): Payer: 59

## 2018-07-16 DIAGNOSIS — S90852A Superficial foreign body, left foot, initial encounter: Secondary | ICD-10-CM | POA: Insufficient documentation

## 2018-07-16 DIAGNOSIS — W458XXD Other foreign body or object entering through skin, subsequent encounter: Secondary | ICD-10-CM

## 2018-07-16 DIAGNOSIS — S90852D Superficial foreign body, left foot, subsequent encounter: Secondary | ICD-10-CM | POA: Diagnosis not present

## 2018-07-16 MED ORDER — CEPHALEXIN 500 MG PO CAPS
500.0000 mg | ORAL_CAPSULE | Freq: Three times a day (TID) | ORAL | 0 refills | Status: DC
Start: 2018-07-16 — End: 2018-07-30

## 2018-07-16 MED ORDER — HYDROCODONE-ACETAMINOPHEN 5-325 MG PO TABS: 1.0000 | ORAL_TABLET | Freq: Three times a day (TID) | ORAL | 0 refills | Status: DC | PRN

## 2018-07-16 NOTE — Assessment & Plan Note (Addendum)
Unsuccessful attempt at removal of foreign body. Incision closed, repeat x-rays today. Hydrocodone for postoperative pain and Keflex for 7 days. Return to see me in 2 weeks, out of work for 2 weeks. I will not charge him for this procedure today.

## 2018-07-16 NOTE — Progress Notes (Signed)
  Subjective: Foreign body in foot: Present for several weeks, minimal discomfort.  Objective: General: Well-developed, well-nourished, and in no acute distress. Left foot: Overall appears unremarkable.  X-rays show a filamentous structure that appears just lateral to the neck of the fifth metatarsal.  Office-based ultrasound shows a hypoechoic structure in the same location approximately 5 mm deep from the skin.  Procedure: Attempted removal of foreign body. Risks, benefits, alternatives explained to patient. Consent obtained. Time out conducted. Noted no overlying induration or erythema at site of injection. Approximately 10 cc of lidocaine with epinephrine infiltrated into the lateral foot around the foreign body. I made a 4 cm incision longitudinally over the lateral foot, then using both sharp and blunt dissection I attempted to find the foreign body, we dissected for approximately 45 minutes and were unable to find it.  I then aborted the procedure and closed the incision with #3 3-0 simple interrupted deep dermal Vicryl sutures, and then #7 simple interrupted 3-0 Ethilon simple interrupted sutures. Wound dressed. Advised to return if increased redness, swelling, drainage, fevers, or chills.  Assessment/plan:   Foreign body in foot, left Unsuccessful attempt at removal of foreign body. Incision closed, repeat x-rays today. Hydrocodone for postoperative pain and Keflex for 7 days. Return to see me in 2 weeks, out of work for 2 weeks. I will not charge him for this procedure today.  ___________________________________________ Ihor Austin. Benjamin Stain, M.D., ABFM., CAQSM. Primary Care and Sports Medicine [facility]  Adjunct Instructor of Family Medicine  [employer]

## 2018-07-30 ENCOUNTER — Ambulatory Visit (INDEPENDENT_AMBULATORY_CARE_PROVIDER_SITE_OTHER): Payer: 59 | Admitting: Sports Medicine

## 2018-07-30 DIAGNOSIS — S90852D Superficial foreign body, left foot, subsequent encounter: Secondary | ICD-10-CM

## 2018-07-30 NOTE — Progress Notes (Signed)
  Subjective: 2 weeks ago I tried to metallic filament foreign body from the lateral left foot, I was unsuccessful.  We did a 2 layer closure and he is here for a wound check.  Pain is for the most part gone.  Objective: General: Well-developed, well-nourished, and in no acute distress. Incision: Clean, dry, intact, minimal bruising, this was cleaned aggressively, and all sutures were removed.  Assessment/plan:   Foreign body in foot, left Unsuccessful metallic filament foreign body removal 2 weeks ago. I did a 2 layer closure, incision looks fantastic. The metallic filament was oriented perpendicular to the skin initially, after the attempted removal it is lying parallel and so I think it would be less symptomatic. We will leave this alone for now. I removed all sutures, we put a piece of gauze and tape on it, I think he can return to work. Return to see me in a month. ___________________________________________ Ihor Austin. Benjamin Stain, M.D., ABFM., CAQSM. Primary Care and Sports Medicine [facility]  Adjunct Instructor of Family Medicine  [employer]

## 2018-07-30 NOTE — Assessment & Plan Note (Signed)
Unsuccessful metallic filament foreign body removal 2 weeks ago. I did a 2 layer closure, incision looks fantastic. The metallic filament was oriented perpendicular to the skin initially, after the attempted removal it is lying parallel and so I think it would be less symptomatic. We will leave this alone for now. I removed all sutures, we put a piece of gauze and tape on it, I think he can return to work. Return to see me in a month.

## 2018-08-28 ENCOUNTER — Ambulatory Visit: Payer: 59 | Admitting: Sports Medicine

## 2018-08-28 ENCOUNTER — Other Ambulatory Visit: Payer: Self-pay

## 2018-08-28 ENCOUNTER — Encounter: Payer: Self-pay | Admitting: Sports Medicine

## 2018-08-28 DIAGNOSIS — B353 Tinea pedis: Secondary | ICD-10-CM

## 2018-08-28 DIAGNOSIS — L84 Corns and callosities: Secondary | ICD-10-CM

## 2018-08-28 DIAGNOSIS — S90852D Superficial foreign body, left foot, subsequent encounter: Secondary | ICD-10-CM | POA: Diagnosis not present

## 2018-08-28 MED ORDER — TERBINAFINE HCL 250 MG PO TABS: 250.0000 mg | ORAL_TABLET | Freq: Every day | ORAL | 1 refills | Status: AC

## 2018-08-28 NOTE — Assessment & Plan Note (Signed)
Looks good. No further follow-up needed. Pain-free.

## 2018-08-28 NOTE — Assessment & Plan Note (Signed)
Adding Lamisil for 1 month. He will take pictures of both of his feet before and after treatment. I did trim significant hyperkeratotic skin.

## 2018-08-28 NOTE — Progress Notes (Signed)
Subjective:    CC: Recheck feet  HPI: Foreign body removal: Incision is clean, dry, intact.  He does have thickened yellow skin on the bottom of his feet, itchiness.  Symptoms are moderate, persistent.  Present for years, localized without radiation.  I reviewed the past medical history, family history, social history, surgical history, and allergies today and no changes were needed.  Please see the problem list section below in epic for further details.  Past Medical History: No past medical history on file. Past Surgical History: Past Surgical History:  Procedure Laterality Date  . Hernial cyst  1990   Social History: Social History   Socioeconomic History  . Marital status: Married    Spouse name: Not on file  . Number of children: Not on file  . Years of education: Not on file  . Highest education level: Not on file  Occupational History  . Not on file  Social Needs  . Financial resource strain: Not on file  . Food insecurity:    Worry: Not on file    Inability: Not on file  . Transportation needs:    Medical: Not on file    Non-medical: Not on file  Tobacco Use  . Smoking status: Never Smoker  . Smokeless tobacco: Never Used  Substance and Sexual Activity  . Alcohol use: Yes    Alcohol/week: 0.0 standard drinks  . Drug use: No  . Sexual activity: Yes    Partners: Female  Lifestyle  . Physical activity:    Days per week: Not on file    Minutes per session: Not on file  . Stress: Not on file  Relationships  . Social connections:    Talks on phone: Not on file    Gets together: Not on file    Attends religious service: Not on file    Active member of club or organization: Not on file    Attends meetings of clubs or organizations: Not on file    Relationship status: Not on file  Other Topics Concern  . Not on file  Social History Narrative  . Not on file   Family History: Family History  Problem Relation Age of Onset  . Cancer Father   . Diabetes  Father   . Hypertension Father    Allergies: No Known Allergies Medications: See med rec.  Review of Systems: No fevers, chills, night sweats, weight loss, chest pain, or shortness of breath.   Objective:    General: Well Developed, well nourished, and in no acute distress.  Neuro: Alert and oriented x3, extra-ocular muscles intact, sensation grossly intact.  HEENT: Normocephalic, atraumatic, pupils equal round reactive to light, neck supple, no masses, no lymphadenopathy, thyroid nonpalpable.  Skin: Warm and dry, no rashes. Cardiac: Regular rate and rhythm, no murmurs rubs or gallops, no lower extremity edema.  Respiratory: Clear to auscultation bilaterally. Not using accessory muscles, speaking in full sentences. Left foot: Incision is clean, dry, intact. Skin is thickened, yellow, consistent with tinea pedis. Range of motion is full in all directions. Strength is 5/5 in all directions. No hallux valgus. No pes cavus or pes planus. No abnormal callus noted. No pain over the navicular prominence, or base of fifth metatarsal. No tenderness to palpation of the calcaneal insertion of plantar fascia. No pain at the Achilles insertion. No pain over the calcaneal bursa. No pain of the retrocalcaneal bursa. No tenderness to palpation over the tarsals, metatarsals, or phalanges. No hallux rigidus or limitus. No tenderness palpation  over interphalangeal joints. No pain with compression of the metatarsal heads. Neurovascularly intact distally.  I trimmed hyperkeratotic skin from the bottom of his foot.  Impression and Recommendations:    Foreign body in foot, left Looks good. No further follow-up needed. Pain-free.   Tinea pedis of both feet Adding Lamisil for 1 month. He will take pictures of both of his feet before and after treatment. I did trim significant hyperkeratotic skin.   ___________________________________________ Ihor Austin. Benjamin Stain, M.D., ABFM., CAQSM.  Primary Care and Sports Medicine [facility]  Adjunct Professor of Family Medicine  [employer]

## 2020-08-18 ENCOUNTER — Ambulatory Visit: Payer: 59 | Admitting: Osteopathic Medicine

## 2020-08-18 ENCOUNTER — Other Ambulatory Visit: Payer: Self-pay

## 2020-08-18 ENCOUNTER — Encounter: Payer: Self-pay | Admitting: Osteopathic Medicine

## 2020-08-18 VITALS — BP 118/69 | HR 53 | Temp 97.7°F | Wt 316.1 lb

## 2020-08-18 DIAGNOSIS — E291 Testicular hypofunction: Secondary | ICD-10-CM

## 2020-08-18 DIAGNOSIS — R7301 Impaired fasting glucose: Secondary | ICD-10-CM | POA: Diagnosis not present

## 2020-08-18 DIAGNOSIS — Z0000 Encounter for general adult medical examination without abnormal findings: Secondary | ICD-10-CM

## 2020-08-18 DIAGNOSIS — E782 Mixed hyperlipidemia: Secondary | ICD-10-CM | POA: Diagnosis not present

## 2020-08-18 NOTE — Patient Instructions (Addendum)
General Preventive Care  Most recent routine screening labs: ordered today.   Blood pressure goal 130/80 or less.   Tobacco: don't!   Alcohol: responsible moderation is ok for most adults - if you have concerns about your alcohol intake, please talk to me!   Exercise: as tolerated to reduce risk of cardiovascular disease and diabetes. Strength training will also prevent osteoporosis.   Mental health: if need for mental health care (medicines, counseling, other), or concerns about moods, please let me know!   Sexual / Reproductive health: if need for STD testing, or if concerns with libido/pain problems, please let me know! If you need to discuss family planning, please let me know!   Advanced Directive: Living Will and/or Healthcare Power of Attorney recommended for all adults, regardless of age or health.  Vaccines  Flu vaccine: for almost everyone, every fall/winter.   Shingles vaccine: after age 7.   Pneumonia vaccines: after age 28.  Tetanus booster: every 10 years.  COVID vaccine: THANKS for getting vaccinated! Boosters recommended!  Cancer screenings   Colon cancer screening: for everyone age 80-75.    Prostate cancer screening: PSA blood test age 60-71  Lung cancer screening: not needed for non-smokers  Infection screenings  . HIV: recommended screening at least once age 49-65 . Gonorrhea/Chlamydia: screening as needed . Hepatitis C: recommended once for everyone age 48-75 . TB: at-risk populations; depending on work requirements and/or travel history

## 2020-08-18 NOTE — Progress Notes (Signed)
Jeremy Bautista is a 35 y.o. male who presents to  [facility] at [facility]  today, 08/18/20, seeking care for the following:  . Annual physical - recent labs for insurance showed elevated cholesterol and A1C, he's back here in the office to get on track! Working on diet/exercise. Has lost 11 lbs in past few months.  . Still some issues a/ low libido. Hx low testosterone, would ike to consider restarting Rx.      ASSESSMENT & PLAN with other pertinent findings:  The primary encounter diagnosis was Annual physical exam. Diagnoses of Hypogonadism in male, Mixed hyperlipidemia, and Abnormal fasting glucose were also pertinent to this visit.    Patient Instructions  General Preventive Care  Most recent routine screening labs: ordered today.   Blood pressure goal 130/80 or less.   Tobacco: don't!   Alcohol: responsible moderation is ok for most adults - if you have concerns about your alcohol intake, please talk to me!   Exercise: as tolerated to reduce risk of cardiovascular disease and diabetes. Strength training will also prevent osteoporosis.   Mental health: if need for mental health care (medicines, counseling, other), or concerns about moods, please let me know!   Sexual / Reproductive health: if need for STD testing, or if concerns with libido/pain problems, please let me know! If you need to discuss family planning, please let me know!   Advanced Directive: Living Will and/or Healthcare Power of Attorney recommended for all adults, regardless of age or health.  Vaccines  Flu vaccine: for almost everyone, every fall/winter.   Shingles vaccine: after age 57.   Pneumonia vaccines: after age 65.  Tetanus booster: every 10 years.  COVID vaccine: THANKS for getting vaccinated! Boosters recommended!  Cancer screenings   Colon cancer screening: for everyone age 47-75.    Prostate cancer screening: PSA blood test age 53-71  Lung  cancer screening: not needed for non-smokers  Infection screenings  . HIV: recommended screening at least once age 24-65 . Gonorrhea/Chlamydia: screening as needed . Hepatitis C: recommended once for everyone age 27-75 . TB: at-risk populations; depending on work requirements and/or travel history   Orders Placed This Encounter  Procedures  . CBC  . COMPLETE METABOLIC PANEL WITH GFR  . Lipid panel  . Hemoglobin A1c  . Testosterone  . PSA, Total with Reflex to PSA, Free    No orders of the defined types were placed in this encounter.    See below for relevant physical exam findings  See below for recent lab and imaging results reviewed  Medications, allergies, PMH, PSH, SocH, FamH reviewed below    Follow-up instructions: Return in about 1 year (around 08/18/2021) for ANNUAL (call week prior to visit for lab orders) / AWAIT LABS, CONSIDER RESTARTING TESTOSTERONE.                                        Exam:  BP 118/69 (BP Location: Left Arm, Patient Position: Sitting, Cuff Size: Large)   Pulse (!) 53   Temp 97.7 F (36.5 C) (Oral)   Wt (!) 316 lb 1.9 oz (143.4 kg)   BMI 41.71 kg/m   Constitutional: VS see above. General Appearance: alert, well-developed, well-nourished, NAD  Neck: No masses, trachea midline.   Respiratory: Normal respiratory effort. no wheeze, no rhonchi, no rales  Cardiovascular: S1/S2 normal, no murmur, no rub/gallop  auscultated. RRR.   Musculoskeletal: Gait normal. Symmetric and independent movement of all extremities  Abdominal: non-tender, non-distended, no appreciable organomegaly, neg Murphy's, BS WNLx4  Neurological: Normal balance/coordination. No tremor.  Skin: warm, dry, intact.   Psychiatric: Normal judgment/insight. Normal mood and affect. Oriented x3.   Current Meds  Medication Sig  . Bioflavonoid Products (SUPER-C 1000 PO) Take by mouth.  . Calcium Polycarbophil (FIBER-CAPS PO) Take by  mouth.  . Multiple Vitamin (MULTIVITAMIN) capsule Take 1 capsule by mouth daily.    No Known Allergies  Patient Active Problem List   Diagnosis Date Noted  . Tinea pedis of both feet 08/28/2018  . Foreign body in foot, left 07/16/2018  . Influenza A 05/11/2017  . Hypogonadism in male 04/22/2014  . Meralgia paraesthetica 04/21/2014  . Low libido 04/21/2014  . Erectile dysfunction 04/21/2014  . BICEPS TENDINITIS, RIGHT 03/15/2010    Family History  Problem Relation Age of Onset  . Cancer Father   . Diabetes Father   . Hypertension Father     Social History   Tobacco Use  Smoking Status Never Smoker  Smokeless Tobacco Never Used    Past Surgical History:  Procedure Laterality Date  . Hernial cyst  1990    Immunization History  Administered Date(s) Administered  . Tdap 06/05/2018    No results found for this or any previous visit (from the past 2160 hour(s)).  No results found.     All questions at time of visit were answered - patient instructed to contact office with any additional concerns or updates. ER/RTC precautions were reviewed with the patient as applicable.   Please note: manual typing as well as voice recognition software may have been used to produce this document - typos may escape review. Please contact Dr. Lyn Hollingshead for any needed clarifications.

## 2021-02-21 ENCOUNTER — Other Ambulatory Visit: Payer: Self-pay | Admitting: Nurse Practitioner

## 2021-02-21 ENCOUNTER — Other Ambulatory Visit: Payer: Self-pay

## 2021-02-21 ENCOUNTER — Ambulatory Visit
Admission: RE | Admit: 2021-02-21 | Discharge: 2021-02-21 | Disposition: A | Discharge status: Home / Self Care | Payer: Worker's Compensation | Source: Ambulatory Visit | Attending: Nurse Practitioner | Admitting: Nurse Practitioner

## 2021-02-21 DIAGNOSIS — M25531 Pain in right wrist: Secondary | ICD-10-CM

## 2021-04-01 ENCOUNTER — Other Ambulatory Visit: Payer: Self-pay

## 2021-04-01 ENCOUNTER — Emergency Department (INDEPENDENT_AMBULATORY_CARE_PROVIDER_SITE_OTHER)
Admission: EM | Admit: 2021-04-01 | Discharge: 2021-04-01 | Disposition: A | Discharge status: Home / Self Care | Payer: 59 | Source: Home / Self Care | Attending: Family Medicine | Admitting: Family Medicine

## 2021-04-01 ENCOUNTER — Encounter: Payer: Self-pay | Admitting: Emergency Medicine

## 2021-04-01 DIAGNOSIS — L247 Irritant contact dermatitis due to plants, except food: Secondary | ICD-10-CM

## 2021-04-01 DIAGNOSIS — L03115 Cellulitis of right lower limb: Secondary | ICD-10-CM | POA: Diagnosis not present

## 2021-04-01 MED ORDER — MUPIROCIN 2 % EX OINT: 1.0000 "application " | TOPICAL_OINTMENT | Freq: Three times a day (TID) | CUTANEOUS | 0 refills | Status: AC

## 2021-04-01 MED ORDER — PREDNISONE 20 MG PO TABS: ORAL_TABLET | ORAL | 0 refills | Status: AC

## 2021-04-01 MED ORDER — DOXYCYCLINE HYCLATE 100 MG PO CAPS: ORAL_CAPSULE | ORAL | 0 refills | Status: AC

## 2021-04-01 NOTE — ED Provider Notes (Signed)
[facility] B[facility]    CSN: [id number] Arrival date & time: 04/01/21  1923      History   Chief Complaint Chief Complaint  Patient presents with   Rash    HPI Jeremy Bautista is a 35 y.o. male.   Patient believes that he came into contact with poison ivy about three weeks ago while lifting a heavy tree limb.  He has had scattered small pruritic lesions on his extremities.  About one week ago a larger well localized erythematous pruritic rash developed on his right anterior thigh.  During the past three days the lesion enlarged an began weeping clear yellow fluid.  He denies swelling, pain, and fevers, chills, and sweats.  The history is provided by the patient.  Rash Location:  Leg Leg rash location:  R upper leg Quality: itchiness, redness and weeping   Quality: not blistering, not burning, not painful and not swelling   Severity:  Moderate Onset quality:  Gradual Duration:  1 week Timing:  Constant Progression:  Worsening Chronicity:  Recurrent Context: plant contact   Relieved by:  Nothing Worsened by:  Nothing Ineffective treatments: calamine lotion. Associated symptoms: no fatigue, no fever, no induration, no joint pain and no nausea    History reviewed. No pertinent past medical history.  Patient Active Problem List   Diagnosis Date Noted   Tinea pedis of both feet 08/28/2018   Foreign body in foot, left 07/16/2018   Influenza A 05/11/2017   Hypogonadism in male 04/22/2014   Meralgia paraesthetica 04/21/2014   Low libido 04/21/2014   Erectile dysfunction 04/21/2014   BICEPS TENDINITIS, RIGHT 03/15/2010    Past Surgical History:  Procedure Laterality Date   Hernial cyst  1990       Home Medications    Prior to Admission medications   Medication Sig Start Date End Date Taking? Authorizing Provider  doxycycline (VIBRAMYCIN) 100 MG capsule Take one cap PO Q12hr with food. 04/01/21  Yes Lattie Haw, MD  mupirocin ointment (BACTROBAN) 2 %  Apply 1 application topically 3 (three) times daily. 04/01/21  Yes Lattie Haw, MD  predniSONE (DELTASONE) 20 MG tablet Take one tab by mouth twice daily for 8 days, then one daily for 4 days. Take with food. 04/01/21  Yes Lattie Haw, MD  Bioflavonoid Products (SUPER-C 1000 PO) Take by mouth. Patient not taking: Reported on 04/01/2021    [provider]  Calcium Polycarbophil (FIBER-CAPS PO) Take by mouth.    [provider]  Multiple Vitamin (MULTIVITAMIN) capsule Take 1 capsule by mouth daily.    [provider]    Family History Family History  Problem Relation Age of Onset   Healthy Mother    Cancer Father    Diabetes Father    Hypertension Father     Social History Social History   Tobacco Use   Smoking status: Never   Smokeless tobacco: Never  Substance Use Topics   Alcohol use: Yes    Alcohol/week: 0.0 standard drinks   Drug use: No     Allergies   Patient has no known allergies.   Review of Systems Review of Systems  Constitutional:  Negative for chills, diaphoresis, fatigue and fever.  Gastrointestinal:  Negative for nausea.  Musculoskeletal:  Negative for arthralgias.  Skin:  Positive for color change and rash.  All other systems reviewed and are negative.   Physical Exam Triage Vital Signs ED Triage Vitals  Enc Vitals Group     BP  04/01/21 1931 135/85     Pulse Rate 04/01/21 1931 85     Resp 04/01/21 1931 15     Temp 04/01/21 1931 98.8 F (37.1 C)     Temp Source 04/01/21 1931 Oral     SpO2 04/01/21 1931 97 %     Weight 04/01/21 1935 (!) 310 lb (140.6 kg)     Height 04/01/21 1935 6\' 1"  (1.854 m)     Head Circumference --      Peak Flow --      Pain Score 04/01/21 1934 2     Pain Loc --      Pain Edu? --      Excl. in GC? --    No data found.  Updated Vital Signs BP 135/85 (BP Location: Left Arm)   Pulse 85   Temp 98.8 F (37.1 C) (Oral)   Resp 15   Ht 6\' 1"  (1.854 m)   Wt (!) 140.6 kg   SpO2 97%    BMI 40.90 kg/m   Visual Acuity Right Eye Distance:   Left Eye Distance:   Bilateral Distance:    Right Eye Near:   Left Eye Near:    Bilateral Near:     Physical Exam Vitals and nursing note reviewed.  Constitutional:      General: He is not in acute distress. HENT:     Head: Normocephalic.  Eyes:     Pupils: Pupils are equal, round, and reactive to light.  Cardiovascular:     Rate and Rhythm: Normal rate.  Pulmonary:     Effort: Pulmonary effort is normal.  Musculoskeletal:        General: No swelling.       Legs:     Comments: On patient's right anterior thigh there is a 5cm diameter macular erythematous lesion weeping clear yellow fluid.  There is no swelling, induration, or fluctuance.  Skin:    General: Skin is warm and dry.     Findings: Rash present.  Neurological:     Mental Status: He is alert.    UC Treatments / Results  Labs (all labs ordered are listed, but only abnormal results are displayed) Labs Reviewed  WOUND CULTURE    EKG   Radiology No results found.  Procedures Procedures (including critical care time)  Medications Ordered in UC Medications - No data to display  Initial Impression / Assessment and Plan / UC Course  I have reviewed the triage vital signs and the nursing notes.  Pertinent labs & imaging results that were available during my care of the patient were reviewed by me and considered in my medical decision making (see chart for details).    Suspect persistent contact dermatitis now developing secondary bacterial infection. Wound culture pending.  Begin doxycycline and topical mupirocin.  Prednisone burst/taper. Followup with Family Doctor if not improved in 7 to 10 days.  Final Clinical Impressions(s) / UC Diagnoses   Final diagnoses:  Irritant contact dermatitis due to plants, except food  Cellulitis of leg, right     Discharge Instructions      Change dressing daily and apply mupirocin ointment to rash.   Keep rash clean and dry.  May discontinue bandage when drainage stops.  Return for any signs of infection (or follow-up with family doctor):  Increasing redness, swelling, pain, heat, drainage, etc.      ED Prescriptions     Medication Sig Dispense Auth. Provider   doxycycline (VIBRAMYCIN) 100 MG capsule Take one  cap PO Q12hr with food. 14 capsule Lattie Haw, MD   predniSONE (DELTASONE) 20 MG tablet Take one tab by mouth twice daily for 8 days, then one daily for 4 days. Take with food. 20 tablet Lattie Haw, MD   mupirocin ointment (BACTROBAN) 2 % Apply 1 application topically 3 (three) times daily. 22 g Lattie Haw, MD         Lattie Haw, MD 04/02/21 [phone]

## 2021-04-01 NOTE — Discharge Instructions (Signed)
Change dressing daily and apply mupirocin ointment to rash.  Keep rash clean and dry.  May discontinue bandage when drainage stops.  Return for any signs of infection (or follow-up with family doctor):  Increasing redness, swelling, pain, heat, drainage, etc.

## 2021-04-01 NOTE — ED Triage Notes (Signed)
Rash to right inner thigh x 2 weeks Treated w/ calamine lotion  Zanfel lotion yesterday & today  Worse the last 2 days - now weeping  Hx of poison ivy in the past

## 2021-04-05 LAB — WOUND CULTURE
MICRO NUMBER:: 12568830
RESULT:: NO GROWTH
SPECIMEN QUALITY:: ADEQUATE

## 2023-05-17 IMAGING — CR DG WRIST COMPLETE 3+V*R*
4 series · 4 of 4 positions shown · non-contrast
Comparison: None.

CLINICAL DATA: Right wrist pain.

EXAM:
RIGHT WRIST - COMPLETE 3+ VIEW

[x wrist pa right]
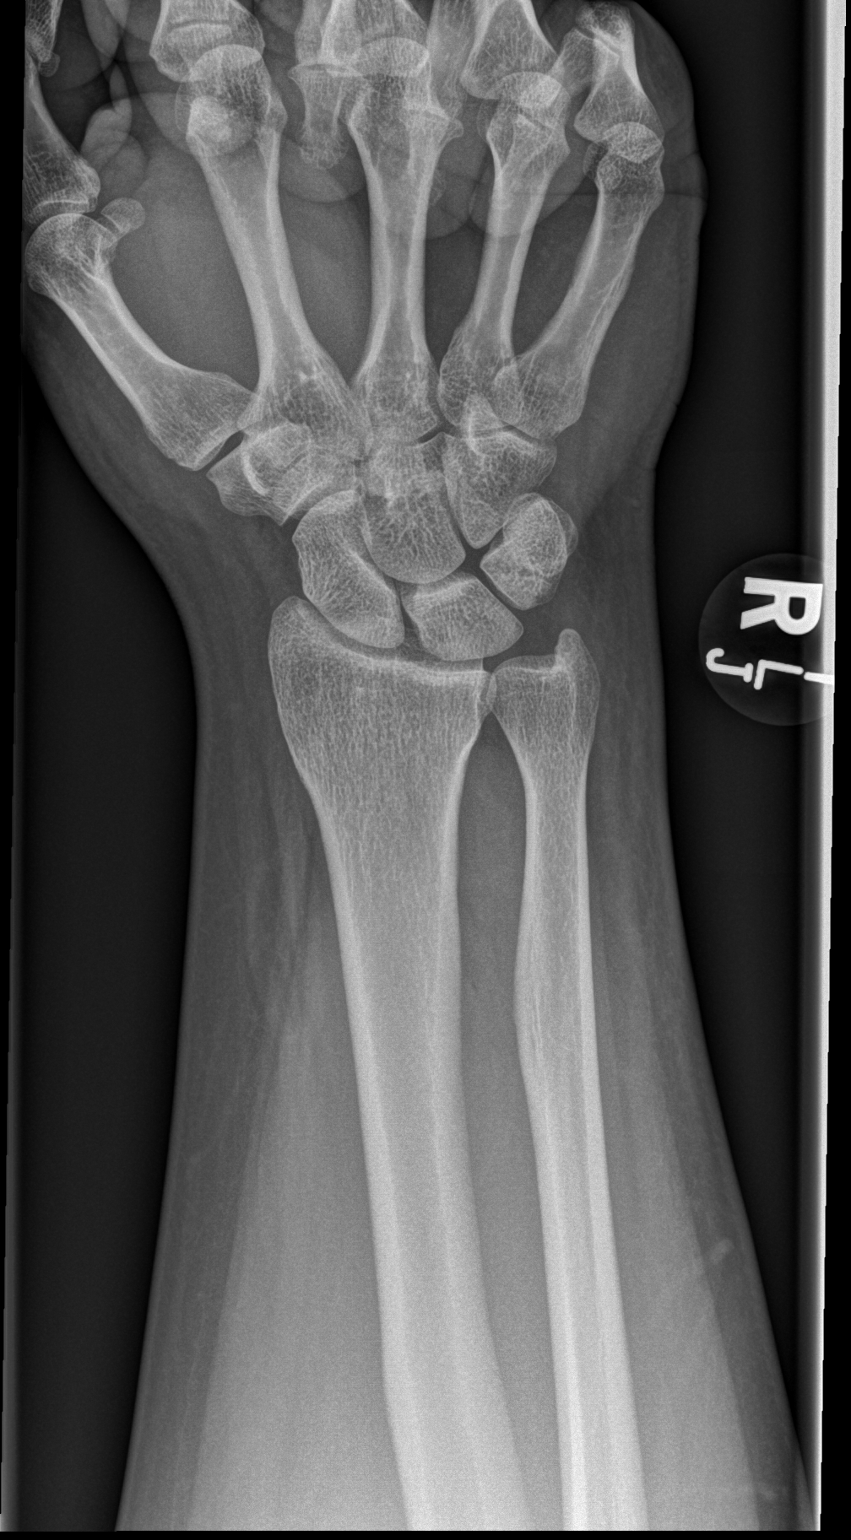

[x wrist obl right]
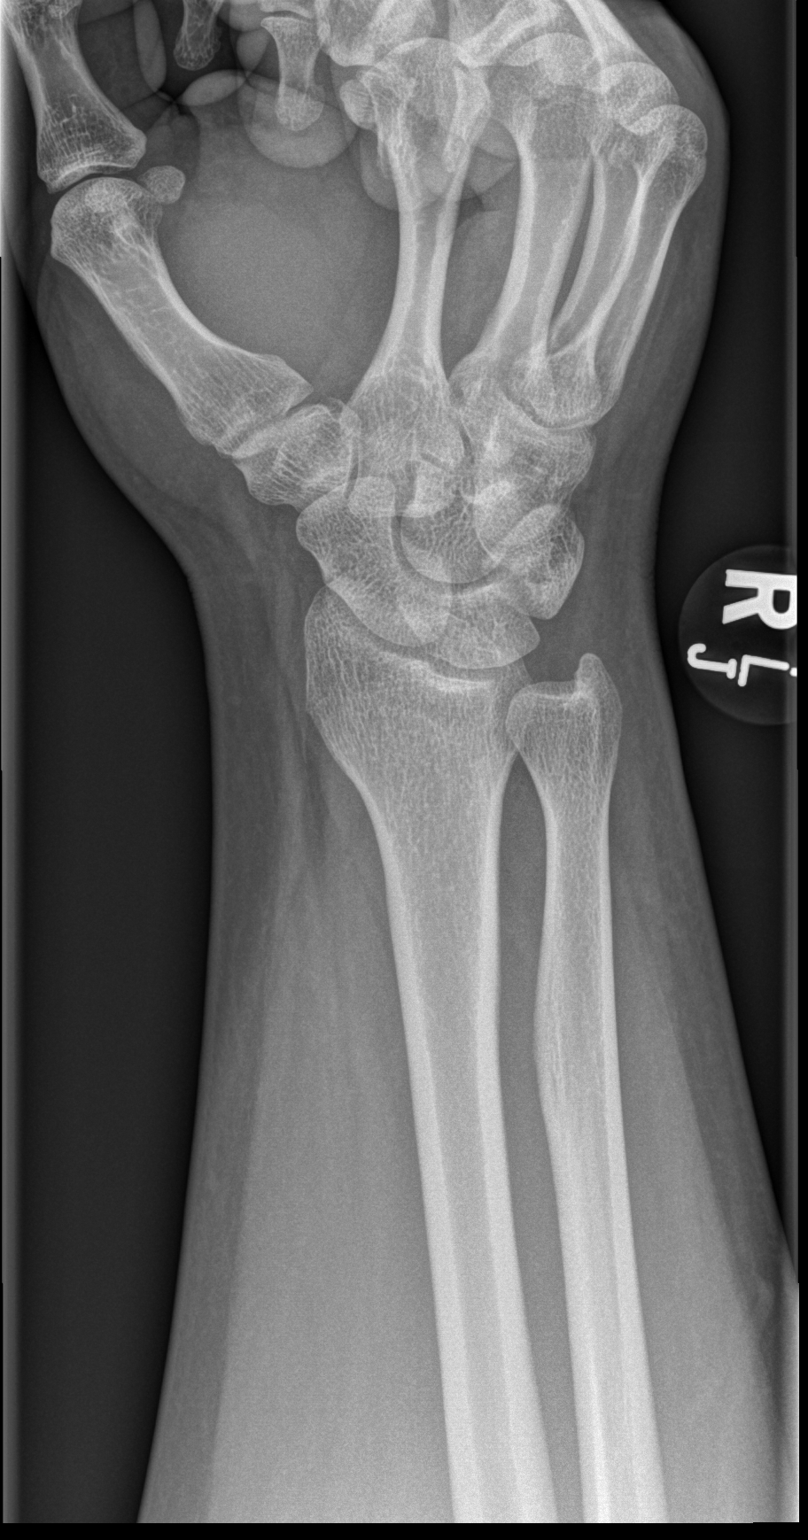

[x wrist lat right]
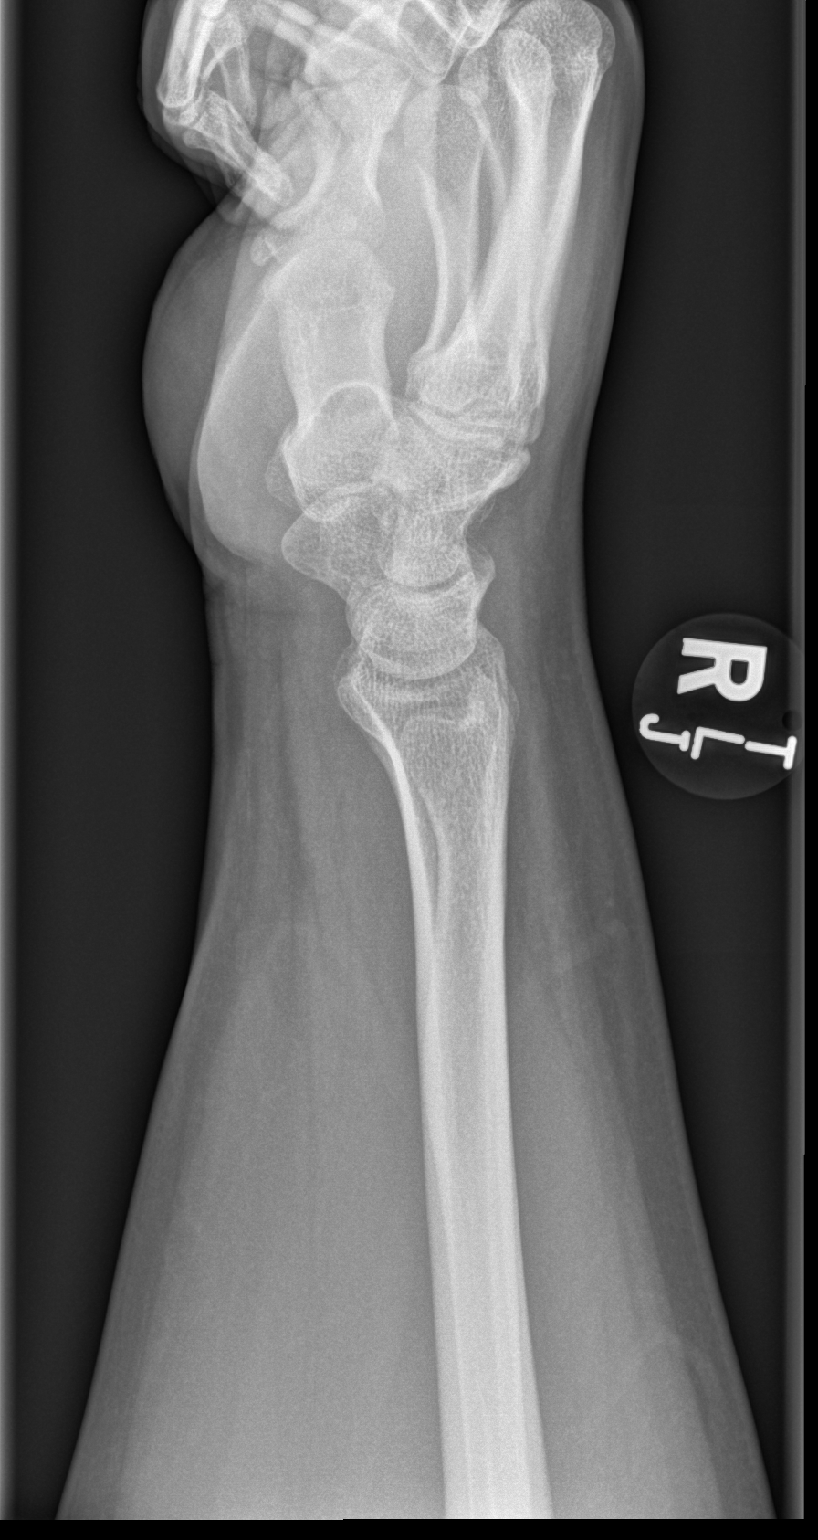

[x wrist navicular view right]
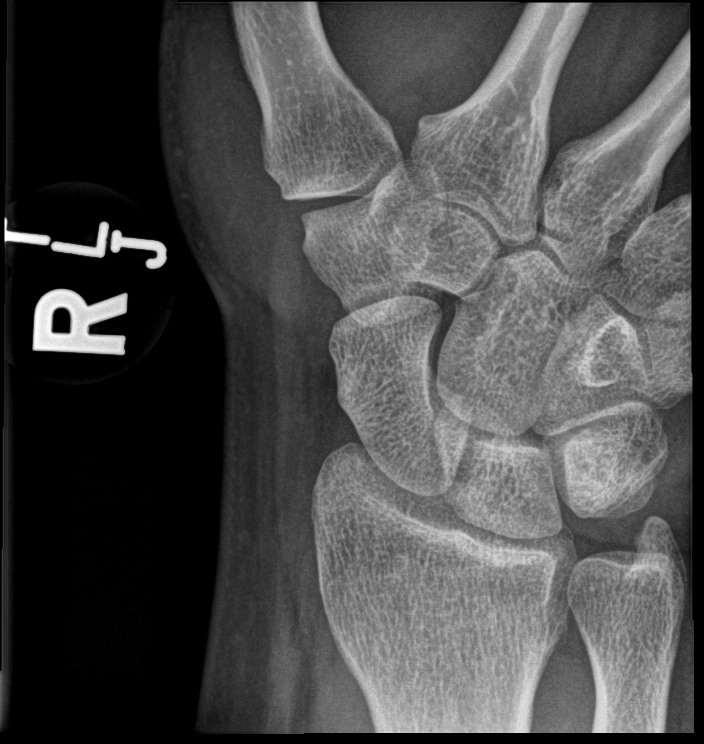

[4 of 4 positions shown; findings below may reference images not displayed]

FINDINGS: There is no evidence of fracture or dislocation. There is no
evidence of arthropathy or other focal bone abnormality. Soft
tissues are unremarkable.
IMPRESSION: Negative.
